# Patient Record
Sex: Male | Born: 1980 | Race: White | Hispanic: No | Marital: Married | Smoking: Never smoker
Health system: Southern US, Community
[De-identification: ages and names within clinical notes are randomized; demographics above are authoritative.]

## PROBLEM LIST (undated history)

## (undated) DIAGNOSIS — L719 Rosacea, unspecified: Secondary | ICD-10-CM

## (undated) DIAGNOSIS — L649 Androgenic alopecia, unspecified: Secondary | ICD-10-CM

## (undated) DIAGNOSIS — G4730 Sleep apnea, unspecified: Secondary | ICD-10-CM

## (undated) DIAGNOSIS — H7290 Unspecified perforation of tympanic membrane, unspecified ear: Secondary | ICD-10-CM

## (undated) DIAGNOSIS — G4733 Obstructive sleep apnea (adult) (pediatric): Secondary | ICD-10-CM

## (undated) DIAGNOSIS — F418 Other specified anxiety disorders: Secondary | ICD-10-CM

## (undated) HISTORY — DX: Rosacea, unspecified: L71.9

## (undated) HISTORY — DX: Other specified anxiety disorders: F41.8

## (undated) HISTORY — DX: Unspecified perforation of tympanic membrane, unspecified ear: H72.90

## (undated) HISTORY — DX: Obstructive sleep apnea (adult) (pediatric): G47.33

## (undated) HISTORY — DX: Androgenic alopecia, unspecified: L64.9

## (undated) HISTORY — DX: Sleep apnea, unspecified: G47.30

---

## 2004-12-11 HISTORY — PX: TYMPANOPLASTY: SHX33

## 2005-12-11 HISTORY — PX: SEPTOPLASTY: SUR1290

## 2011-08-04 ENCOUNTER — Encounter: Payer: Self-pay | Admitting: Family Medicine

## 2011-08-04 ENCOUNTER — Ambulatory Visit (INDEPENDENT_AMBULATORY_CARE_PROVIDER_SITE_OTHER): Payer: BC Managed Care – PPO | Admitting: Family Medicine

## 2011-08-04 VITALS — BP 104/68 | HR 87 | Temp 98.2°F | Ht 73.0 in | Wt 187.0 lb

## 2011-08-04 DIAGNOSIS — L719 Rosacea, unspecified: Secondary | ICD-10-CM

## 2011-08-04 DIAGNOSIS — F418 Other specified anxiety disorders: Secondary | ICD-10-CM

## 2011-08-04 DIAGNOSIS — F341 Dysthymic disorder: Secondary | ICD-10-CM

## 2011-08-04 DIAGNOSIS — G4733 Obstructive sleep apnea (adult) (pediatric): Secondary | ICD-10-CM

## 2011-08-04 MED ORDER — BUPROPION HCL ER (XL) 150 MG PO TB24: 150.0000 mg | ORAL_TABLET | Freq: Every day | ORAL | Status: DC

## 2011-08-04 NOTE — Progress Notes (Signed)
  Subjective:    Patient ID: Darren Mejia, male    DOB: Dec 16, 1980, 30 y.o.   MRN: [id number]  HPI 30 yr old male to establish with [place] after moving here from [place], [place] in May. 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 He has several ongoing issues. He has been treated for depression with anxiety since 2009, and currently he is taking Lexapro and Wellbutrin. He feels much better lately and asks if he can stop one or both of these. He has rosacea, and he had been treated by a Dermatologist before. His current regimen works well. He was diagnosed with sleep apnea in 2007, and he has had several sleep studies. He had a nasal septoplasty, and he wore a dental appliance for awhile. Then he was put on CPAP last year, and this has worked very well for him. He had a cpx with labs through his employer in April of this year, and everything came out okay.    Review of Systems  Constitutional: Negative.   HENT: Negative.   Respiratory: Negative.   Cardiovascular: Negative.   Psychiatric/Behavioral: Negative.        Objective:   Physical Exam  Constitutional: He appears well-developed and well-nourished.  Cardiovascular: Normal rate, regular rhythm, normal heart sounds and intact distal pulses.   Pulmonary/Chest: Effort normal and breath sounds normal.  Psychiatric: He has a normal mood and affect. His behavior is normal. Thought content normal.          Assessment & Plan:  He seems to be doing well. We agreed to stop the Lexapro, but I advised him to stay on Wellbutrin for now. Will refer to Dermatology and Pulmonary for the rosacea and OSA.

## 2011-08-17 ENCOUNTER — Ambulatory Visit: Payer: Self-pay | Admitting: Family Medicine

## 2011-08-22 ENCOUNTER — Institutional Professional Consult (permissible substitution): Payer: BC Managed Care – PPO | Admitting: Pulmonary Disease

## 2011-09-01 ENCOUNTER — Ambulatory Visit (INDEPENDENT_AMBULATORY_CARE_PROVIDER_SITE_OTHER): Payer: BC Managed Care – PPO | Admitting: Pulmonary Disease

## 2011-09-01 ENCOUNTER — Encounter: Payer: Self-pay | Admitting: Pulmonary Disease

## 2011-09-01 VITALS — BP 110/82 | HR 76 | Temp 98.1°F | Ht 72.0 in | Wt 188.2 lb

## 2011-09-01 DIAGNOSIS — G4733 Obstructive sleep apnea (adult) (pediatric): Secondary | ICD-10-CM | POA: Insufficient documentation

## 2011-09-01 NOTE — Progress Notes (Signed)
Subjective:    Patient ID: Darren Mejia, male    DOB: Jun 29, 1981, 30 y.o.   MRN: [id number]  HPI The patient is a 30 year old male who I've been asked to see for management of obstructive sleep apnea.  He recently moved here from the [place] area, he needs to establish with a sleep physician and DME.  He was diagnosed with very mild sleep apnea in 2010, with an RDI of 9 events per hour.  He was initially treated with a dental appliance, but this resulted in jaw discomfort.  The patient decided to try CPAP in 2011, and has done very well with this.  He saw improvement in his sleep and daytime alertness with this.  The patient uses nasal pillows, and has no issues with oral venting.  He uses an auto set with pressures ranging 5-8 cm.  Down load had shown good compliance, but he does have some time at the upper pressure level of 8 cm.  His AHI has been in the range of 4 per hour on this setting.  The patient continues to have some daytime tiredness, but not true sleepiness.  He has been tried on Wellbutrin for possible low-level depression, and this has helped with his sleep and daytime symptoms.  His Epworth score today is 6, and his weight is unchanged from his last sleep study.  Sleep Questionnaire: What time do you typically go to bed?( Between what hours) 10 to 11 pm How long does it take you to fall asleep? 10 to 20 ins How many times during the night do you wake up? 3 What time do you get out of bed to start your day? 0600 Do you drive or operate heavy machinery in your occupation? No How much has your weight changed (up or down) over the past two years? (In pounds) 0 oz (0 kg) Have you ever had a sleep study before? Yes If yes, location of study[facility] [place] area If yes, date of study? June 2010 and April 2011 Do you currently use CPAP? Yes If so, what pressure? Do you wear oxygen at any time? No    Review of Systems  Constitutional: Negative for fever and unexpected weight change.   HENT: Negative for ear pain, nosebleeds, congestion, sore throat, rhinorrhea, sneezing, trouble swallowing, dental problem, postnasal drip and sinus pressure.   Eyes: Negative for redness and itching.  Respiratory: Negative for cough, chest tightness, shortness of breath and wheezing.   Cardiovascular: Negative for palpitations and leg swelling.  Gastrointestinal: Negative for nausea and vomiting.  Genitourinary: Negative for dysuria.  Musculoskeletal: Negative for joint swelling.  Skin: Negative for rash.  Neurological: Negative for headaches.  Hematological: Does not bruise/bleed easily.  Psychiatric/Behavioral: Negative for dysphoric mood. The patient is not nervous/anxious.        Objective:   Physical Exam Constitutional:  Well developed, no acute distress  HENT:  Nares patent without discharge  Oropharynx without exudate, palate and uvula are moderately elongated.  Eyes:  Perrla, eomi, no scleral icterus  Neck:  No JVD, no TMG  Cardiovascular:  Normal rate, regular rhythm, no rubs or gallops.  No murmurs        Intact distal pulses  Pulmonary :  Normal breath sounds, no stridor or respiratory distress   No rales, rhonchi, or wheezing  Abdominal:  Soft, nondistended, bowel sounds present.  No tenderness noted.   Musculoskeletal:  No lower extremity edema noted.  Lymph Nodes:  No cervical lymphadenopathy  noted  Skin:  No cyanosis noted  Neurologic:  Alert, does not appear sleepy, moves all 4 extremities without obvious deficit. .       Assessment & Plan:

## 2011-09-01 NOTE — Assessment & Plan Note (Signed)
The patient has been on CPAP for his very mild sleep apnea, and feels that his symptoms are definitely improved with the device.  He has persistent daytime fatigue/tiredness, but it is unclear whether this is truly due to his sleep disordered breathing or possibly to something such as mild depression?  His download did show that he reached the ceiling pressure of 8 cm for a fair amount of time, and I would like to try him on a little bit higher pressure to see if he notices a difference.  The patient is willing to give this a try.

## 2011-09-01 NOTE — Patient Instructions (Signed)
Will see how you do on expanded pressure range on auto to 5-12cm.  Will get download and call you with results when available. Will get you assigned to new dme. If doing well, would like to see you back in one year.

## 2011-11-01 ENCOUNTER — Ambulatory Visit: Payer: BC Managed Care – PPO | Admitting: Family Medicine

## 2011-11-21 ENCOUNTER — Ambulatory Visit: Payer: BC Managed Care – PPO | Admitting: Family Medicine

## 2011-12-21 ENCOUNTER — Encounter: Payer: Self-pay | Admitting: Family Medicine

## 2011-12-21 ENCOUNTER — Ambulatory Visit (INDEPENDENT_AMBULATORY_CARE_PROVIDER_SITE_OTHER): Payer: BC Managed Care – PPO | Admitting: Family Medicine

## 2011-12-21 VITALS — BP 120/58 | HR 82 | Temp 98.4°F | Wt 186.0 lb

## 2011-12-21 DIAGNOSIS — F418 Other specified anxiety disorders: Secondary | ICD-10-CM

## 2011-12-21 DIAGNOSIS — F341 Dysthymic disorder: Secondary | ICD-10-CM

## 2011-12-21 NOTE — Progress Notes (Signed)
  Subjective:    Patient ID: Darren Mejia, male    DOB: May 11, 1981, 31 y.o.   MRN: [id number]  HPI Here to follow up on anxiety and depression. Several months ago he stopped Lexapro and stayed on Wellbutrin. He did well with this transition, and now he is thinking about coming off wellbutrin. He feels well and his mood is excellent. He sleeps well. He is exercising regularly.    Review of Systems  Constitutional: Negative.   Psychiatric/Behavioral: Negative.        Objective:   Physical Exam  Constitutional: He appears well-developed and well-nourished.  Psychiatric: He has a normal mood and affect. His behavior is normal. Judgment and thought content normal.          Assessment & Plan:  He is doing well. He will stop the Wellbutrin immediately. Recheck prn

## 2012-07-20 ENCOUNTER — Ambulatory Visit (INDEPENDENT_AMBULATORY_CARE_PROVIDER_SITE_OTHER): Payer: BC Managed Care – PPO | Admitting: Family Medicine

## 2012-07-20 ENCOUNTER — Encounter: Payer: Self-pay | Admitting: Family Medicine

## 2012-07-20 VITALS — BP 124/86 | HR 61 | Temp 97.4°F | Ht 72.0 in | Wt 164.0 lb

## 2012-07-20 DIAGNOSIS — H6690 Otitis media, unspecified, unspecified ear: Secondary | ICD-10-CM

## 2012-07-20 MED ORDER — CIPROFLOXACIN-DEXAMETHASONE 0.3-0.1 % OT SUSP: 4.0000 [drp] | Freq: Two times a day (BID) | OTIC | Status: DC

## 2012-07-20 NOTE — Patient Instructions (Addendum)
Start the Ciprodex twice daily for the ear infection for 7 days Avoid water in the ear Call with any questions or concerns Hang in there!!!

## 2012-07-20 NOTE — Progress Notes (Signed)
  Subjective:    Patient ID: Darren Mejia, male    DOB: [birth year], 31 y.o.   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  Wears custom fit ear plug but got water in anyway. No sensation of fluid in the ear, 'feels mildly clogged', some discomfort.  No drainage from ear.    Review of Systems For ROS see HPI     Objective:   Physical Exam  Vitals reviewed. Constitutional: He appears well-developed and well-nourished. No distress.  HENT:  Head: Normocephalic and atraumatic.  Right Ear: External ear normal.  Left Ear: External ear normal.  Nose: Nose normal.  Mouth/Throat: Oropharynx is clear and moist.       L TM w/ chronic perforation, erythema and fluid present R TM WNL  Neck: Normal range of motion. Neck supple.  Lymphadenopathy:    He has no cervical adenopathy.  Skin: He is not diaphoretic.          Assessment & Plan:

## 2012-07-23 NOTE — Assessment & Plan Note (Signed)
New to provider.  Start Cipro drops for early infxn.  Reviewed supportive care and red flags that should prompt return.  Pt expressed understanding and is in agreement w/ plan.

## 2012-09-02 ENCOUNTER — Ambulatory Visit: Payer: BC Managed Care – PPO | Admitting: Internal Medicine

## 2012-09-03 ENCOUNTER — Ambulatory Visit (INDEPENDENT_AMBULATORY_CARE_PROVIDER_SITE_OTHER): Payer: BC Managed Care – PPO | Admitting: Pulmonary Disease

## 2012-09-03 ENCOUNTER — Encounter: Payer: Self-pay | Admitting: Pulmonary Disease

## 2012-09-03 VITALS — BP 110/80 | HR 59 | Temp 98.2°F | Ht 72.0 in | Wt 165.8 lb

## 2012-09-03 DIAGNOSIS — G4733 Obstructive sleep apnea (adult) (pediatric): Secondary | ICD-10-CM

## 2012-09-03 NOTE — Assessment & Plan Note (Signed)
The patient overall is doing well on his CPAP.  He is having no issues with his mask or pressure, and has been keeping up with his supplies.  I will try and get a download off his machine, and I have asked him to continue with his CPAP as he is doing.  Followup with me in one year.

## 2012-09-03 NOTE — Patient Instructions (Addendum)
Continue with cpap, and keep up with mask changes and supplies. Will download your card and send back to you. followup with me in one year if doing well.

## 2012-09-03 NOTE — Progress Notes (Signed)
  Subjective:    Patient ID: Darren Mejia, male    DOB: 1981-02-06, 31 y.o.   MRN: [id number]  HPI The patient comes in today for followup of his obstructive sleep apnea.  He's been wearing CPAP compliant, and is having no issues with his mask fit or pressure.  He feels that he is sleeping well overall, and is satisfied with his daytime alertness.   Review of Systems  Constitutional: Negative for fever, chills, diaphoresis, activity change, appetite change, fatigue and unexpected weight change.  HENT: Negative for nosebleeds, congestion, sore throat, rhinorrhea, voice change and postnasal drip.   Eyes: Negative for visual disturbance.  Respiratory: Negative for cough, chest tightness, shortness of breath and wheezing.   Cardiovascular: Negative for chest pain and palpitations.  Gastrointestinal: Negative for nausea, vomiting and constipation.  Genitourinary: Negative for flank pain and difficulty urinating.  Musculoskeletal: Negative for joint swelling.  Skin: Negative for rash.  Neurological: Negative for dizziness, syncope, weakness, light-headedness, numbness and headaches.  Hematological: Does not bruise/bleed easily.  Psychiatric/Behavioral: Negative for confusion and agitation. The patient is not nervous/anxious.        Objective:   Physical Exam Thin male in no acute distress No skin breakdown or pressure necrosis from the CPAP mask Lower extremities without edema, no cyanosis Alert and oriented, does not appear to be sleepy, moves all 4 extremities.       Assessment & Plan:

## 2012-11-03 ENCOUNTER — Telehealth: Payer: Self-pay | Admitting: Pulmonary Disease

## 2012-11-03 ENCOUNTER — Other Ambulatory Visit: Payer: Self-pay | Admitting: Pulmonary Disease

## 2012-11-03 DIAGNOSIS — G4733 Obstructive sleep apnea (adult) (pediatric): Secondary | ICD-10-CM

## 2012-11-03 NOTE — Telephone Encounter (Signed)
Please let pt that I have his recent download, and shows excellent compliance, and very good control of his sleep apnea.  I can't remember if he left his card with [place] or not.  I think he did, and if so, we are supposed to send back to him.  I can't remember who was with me that day.  ?Kim?  See if she has this card.  Thanks.

## 2012-11-04 NOTE — Telephone Encounter (Signed)
LMOMTCB x 1 

## 2012-11-05 ENCOUNTER — Encounter: Payer: Self-pay | Admitting: Family Medicine

## 2012-11-05 ENCOUNTER — Ambulatory Visit (INDEPENDENT_AMBULATORY_CARE_PROVIDER_SITE_OTHER): Payer: BC Managed Care – PPO | Admitting: Family Medicine

## 2012-11-05 VITALS — BP 112/76 | HR 90 | Temp 98.2°F | Wt 160.0 lb

## 2012-11-05 DIAGNOSIS — IMO0002 Reserved for concepts with insufficient information to code with codable children: Secondary | ICD-10-CM

## 2012-11-05 DIAGNOSIS — S76019A Strain of muscle, fascia and tendon of unspecified hip, initial encounter: Secondary | ICD-10-CM

## 2012-11-05 NOTE — Telephone Encounter (Signed)
Pt notified of download results per Dr. Shelle Iron. Pt says he did receive his card in the mail for his machine.

## 2012-11-05 NOTE — Progress Notes (Signed)
  Subjective:    Patient ID: Darren Mejia, male    DOB: Sep 27, 1981, 31 y.o.   MRN: [id number]  HPI Here for recurrent pain in the right hip and groin after it started to hurt one month ago. He runs 5 miles at a time 3-4 days a week and swims on days in between. After this pain began he thought it was a muscle strain so he stoppped running a few times, the longest being for 10 days. Each time it felt better but then the pain returned when he resumed running. He noe has avoided running the past 13 days and the pain is better. Using Advil prn.    Review of Systems  Constitutional: Negative.   Musculoskeletal: Positive for myalgias and arthralgias.       Objective:   Physical Exam  Constitutional: He appears well-developed and well-nourished.  Musculoskeletal:       Tender in the right groin and along the right anterior superior iliac spine, full ROM, no edema           Assessment & Plan:  This is a hip flexor strain. He needs to rest the area long enough to let it heal, and he needs to avoid running for 4 more weeks (a total of 6 weeks off). Then ease back into running.

## 2012-11-05 NOTE — Telephone Encounter (Signed)
Pt returned call. Darren Mejia  

## 2012-12-11 HISTORY — PX: HIP ARTHROSCOPY: SUR88

## 2012-12-16 ENCOUNTER — Ambulatory Visit (INDEPENDENT_AMBULATORY_CARE_PROVIDER_SITE_OTHER): Payer: BC Managed Care – PPO | Admitting: Family Medicine

## 2012-12-16 ENCOUNTER — Encounter: Payer: Self-pay | Admitting: Family Medicine

## 2012-12-16 VITALS — BP 118/78 | HR 90 | Temp 98.1°F | Wt 164.0 lb

## 2012-12-16 DIAGNOSIS — F411 Generalized anxiety disorder: Secondary | ICD-10-CM

## 2012-12-16 DIAGNOSIS — F419 Anxiety disorder, unspecified: Secondary | ICD-10-CM

## 2012-12-16 DIAGNOSIS — S76019A Strain of muscle, fascia and tendon of unspecified hip, initial encounter: Secondary | ICD-10-CM

## 2012-12-16 DIAGNOSIS — IMO0002 Reserved for concepts with insufficient information to code with codable children: Secondary | ICD-10-CM

## 2012-12-16 MED ORDER — PROPRANOLOL HCL 10 MG PO TABS: 10.0000 mg | ORAL_TABLET | Freq: Every day | ORAL | Status: DC | PRN

## 2012-12-16 NOTE — Progress Notes (Signed)
  Subjective:    Patient ID: Golden Gilreath, male    DOB: 1981/08/26, 32 y.o.   MRN: [id number]  HPI Here to follow up on a right hip flexor injury and to discuss anxiety. He was here about 6 weeks ago, and we recommended he stop running for 4 weeks and swim instead to allow the injury to heal. He did this, and in fact he felt much better. The pain had completely resolved so he started running again. He did well the first day but the second day the pain returned, and it is still bothering him a little. Also he has been under a lot of job stress lately and he has had to do some public speaking and some presentations. He finds these very difficult because he gets very anxious, his voice cracks,his heart pounds, etc.    Review of Systems  Constitutional: Negative.   Musculoskeletal: Positive for arthralgias.  Psychiatric/Behavioral: The patient is nervous/anxious.        Objective:   Physical Exam  Constitutional: He appears well-developed and well-nourished.  Cardiovascular: Normal rate, regular rhythm, normal heart sounds and intact distal pulses.   Pulmonary/Chest: Effort normal and breath sounds normal.  Musculoskeletal:       Tender over the right hip flexor tendon, no masses, full ROM  Psychiatric: He has a normal mood and affect. His behavior is normal. Thought content normal.          Assessment & Plan:  We will send him to PT for the hip flexor strain, and they can give him more detailed advice about exercising. Try Propranolol for days when he has a presentation at work.

## 2012-12-17 ENCOUNTER — Telehealth: Payer: Self-pay | Admitting: Family Medicine

## 2012-12-17 NOTE — Telephone Encounter (Signed)
Pt was here yesterday, saw Dr. Clent Ridges, and a PT referral was entered. Dr. Clent Ridges said he could do [facility], or someone else, the pt had a preference. Pt would like to do his PT at [facility] - with Erick Alley. Their fax # is: [phone].

## 2013-01-02 ENCOUNTER — Telehealth: Payer: Self-pay | Admitting: Family Medicine

## 2013-01-02 NOTE — Telephone Encounter (Signed)
Pt has been attending PT. Therapist recommend iontophoresis patch, but would also recommend an oral anti-inflammatory for pt. Pt would like to know if that would be ok together w/ the patch therapy, and at what dose? Pls advise. Pharm: [facility]

## 2013-01-03 NOTE — Telephone Encounter (Signed)
That would be fine. Call in Diclofenac 75 mg bid, #60 with 5 rf

## 2013-01-06 MED ORDER — DICLOFENAC SODIUM 75 MG PO TBEC: 75.0000 mg | DELAYED_RELEASE_TABLET | Freq: Two times a day (BID) | ORAL | Status: DC

## 2013-01-06 NOTE — Telephone Encounter (Signed)
Patient called stating that he would like his request processed today.

## 2013-01-06 NOTE — Telephone Encounter (Signed)
Pt waiting to hear about advice/ medicine.

## 2013-01-06 NOTE — Telephone Encounter (Signed)
I sent script e-scribe and left voice message for pt 

## 2013-03-10 ENCOUNTER — Other Ambulatory Visit: Payer: Self-pay | Admitting: Orthopedic Surgery

## 2013-03-10 DIAGNOSIS — M25551 Pain in right hip: Secondary | ICD-10-CM

## 2013-03-17 ENCOUNTER — Ambulatory Visit
Admission: RE | Admit: 2013-03-17 | Discharge: 2013-03-17 | Disposition: A | Discharge status: Home / Self Care | Payer: BC Managed Care – PPO | Source: Ambulatory Visit | Attending: Orthopedic Surgery | Admitting: Orthopedic Surgery

## 2013-03-17 DIAGNOSIS — M25551 Pain in right hip: Secondary | ICD-10-CM

## 2013-03-17 MED ORDER — IOHEXOL 180 MG/ML  SOLN: 13.0000 mL | Freq: Once | INTRAMUSCULAR | Status: AC | PRN

## 2013-04-14 ENCOUNTER — Telehealth: Payer: Self-pay | Admitting: Family Medicine

## 2013-04-14 NOTE — Telephone Encounter (Signed)
Patient Information:  Caller Name: Joanthan  Phone: [phone]  Patient: Darren Mejia, Darren Mejia  Gender: Male  DOB: March 22, 1981  Age: 32 Years  PCP: Gershon Crane [facility])  Office Follow Up:  Does the office need to follow up with this patient?: No  Instructions For The Office: N/A  RN Note:  Patient unable to arrange appointment for 5-6 due to scheduling conflict. Wants appointment for 5-7 and scheduled. Call back parametrs given.  Symptoms  Reason For Call & Symptoms: Has perforated ear drum since "I was a child". Was swimming 5-4 and has gotten water in ear causing pain. Has used Ciprodex that was left from when used in past. Is almost out and wants to know what to do. No drainage, swelling of outer ear.   Reviewed Health History In EMR: Yes  Reviewed Medications In EMR: Yes  Reviewed Allergies In EMR: Yes  Reviewed Surgeries / Procedures: Yes  Date of Onset of Symptoms: 04/13/2013  Guideline(s) Used:  Earache  Disposition Per Guideline:   See Today in Office  Reason For Disposition Reached:   All other earaches (Exceptions: earache lasting < 1 hour, and earache from air travel)  Advice Given:  N/A  Patient Will Follow Care Advice:  YES  Appointment Scheduled:  04/16/2013 09:30:00 Appointment Scheduled Provider:  Gershon Crane [facility])

## 2013-04-16 ENCOUNTER — Encounter: Payer: Self-pay | Admitting: Family Medicine

## 2013-04-16 ENCOUNTER — Ambulatory Visit (INDEPENDENT_AMBULATORY_CARE_PROVIDER_SITE_OTHER): Payer: BC Managed Care – PPO | Admitting: Family Medicine

## 2013-04-16 VITALS — BP 110/70 | HR 79 | Temp 98.2°F | Wt 161.0 lb

## 2013-04-16 DIAGNOSIS — H60392 Other infective otitis externa, left ear: Secondary | ICD-10-CM

## 2013-04-16 DIAGNOSIS — H60399 Other infective otitis externa, unspecified ear: Secondary | ICD-10-CM

## 2013-04-16 MED ORDER — CIPROFLOXACIN-DEXAMETHASONE 0.3-0.1 % OT SUSP: 4.0000 [drp] | Freq: Two times a day (BID) | OTIC | Status: DC

## 2013-04-16 NOTE — Progress Notes (Signed)
  Subjective:    Patient ID: Darren Mejia, male    DOB: [birth year], 32 y.o.   MRN: [id number]  HPI Here for left ear pain. He has a chronic left TM perforation, and he swims regularly for exercise. Even though he wears a molded ear plug, he still gets water in the ear several times a year which leads to an infection. Ciprodex usually works well for him. No URI or sinus symptoms.    Review of Systems  Constitutional: Negative.   HENT: Positive for ear pain. Negative for hearing loss, congestion, sinus pressure, tinnitus and ear discharge.   Eyes: Negative.        Objective:   Physical Exam  Constitutional: He appears well-developed and well-nourished.  HENT:  Right Ear: External ear normal.  Nose: Nose normal.  Mouth/Throat: Oropharynx is clear and moist.  Left TM has a large perforation. The external canal is pink           Assessment & Plan:  Treat with Ciprodex again

## 2013-04-17 ENCOUNTER — Telehealth: Payer: Self-pay | Admitting: Family Medicine

## 2013-04-17 MED ORDER — PROPRANOLOL HCL 10 MG PO TABS: 10.0000 mg | ORAL_TABLET | Freq: Every day | ORAL | Status: DC | PRN

## 2013-04-17 NOTE — Telephone Encounter (Signed)
Refill request for Propranol 10 mg and a 90 day supply, which I did send e-scribe.

## 2013-04-24 ENCOUNTER — Encounter (INDEPENDENT_AMBULATORY_CARE_PROVIDER_SITE_OTHER): Payer: Self-pay | Admitting: General Surgery

## 2013-04-24 ENCOUNTER — Ambulatory Visit (INDEPENDENT_AMBULATORY_CARE_PROVIDER_SITE_OTHER): Payer: BC Managed Care – PPO | Admitting: General Surgery

## 2013-04-24 VITALS — BP 128/86 | HR 55 | Temp 97.6°F | Ht 72.0 in | Wt 159.6 lb

## 2013-04-24 DIAGNOSIS — IMO0002 Reserved for concepts with insufficient information to code with codable children: Secondary | ICD-10-CM

## 2013-04-24 DIAGNOSIS — S76219A Strain of adductor muscle, fascia and tendon of unspecified thigh, initial encounter: Secondary | ICD-10-CM

## 2013-04-24 NOTE — Progress Notes (Signed)
Patient ID: Darren Mejia, male   DOB: 1981-09-20, 32 y.o.   MRN: [id number]  Chief Complaint  Patient presents with  . New Evaluation    eval hernia    HPI Darren Mejia is a 32 y.o. male.   HPI  He is referred by Dr. Dannielle Huh  for further evaluation of right groin pain and possible sports hernia.  He was in his normal state of health until approximately 10/07/2012. He had been running every other day up to that time. He ran 2 days in the row and began having some pain in the right groin area. He ran again 2 days later and felt that the pain was a little worse. He rested a few days and then ran again which made the pain worse still. He rested a week after that and still had significant pain. He subsequently stopped running. He saw his primary care physician and was given a diagnosis of flexor strain. He was advised not to run for 4 more weeks. He started doing some swimming.  After being off running for about 6 weeks, and he tried running again in late December which created the pain once again. He subsequently underwent physical therapy and started taking a nonsteroidal anti-inflammatory drug. This caused improvement of his pain but whenever he tried to increase his activity the pain returned. He was swimming with a pull bouy at this time successfully. He stated the pain at that time was sometimes in the lateral aspect of leg, the buttock area, where the anterior aspect of the leg. He was able to do upper body lifting without pain. He then saw Dr. Sherlean Foot in late January. A plain x-ray was unremarkable. He underwent 6 weeks of physical therapy but was not better. An MRI was ordered which did not demonstrate any pathology. Dr. Sherlean Foot felt most likely to as a hip flexor/adductor strain. However he could not rule out sports hernia or lower back issue causing the pain. Subsequently he was referred to me.  Now, even upper body weight lifting and swimming causing discomfort. There is no family history of inguinal  hernia. He is a heel to toe runner.  Past Medical History  Diagnosis Date  . Male pattern baldness   . Depression with anxiety   . Acne rosacea   . OSA (obstructive sleep apnea)   . Anxiety   . Depression   . Perforated ear drum     left TM     Past Surgical History  Procedure Laterality Date  . Nasal septum surgery  2007  . Tympanoplasty  2006    left side     Family History  Problem Relation Age of Onset  . Heart disease Father   . Stroke Father   . Hypertension Father   . Atrial fibrillation Father   . Emphysema Paternal Grandfather     Social History History  Substance Use Topics  . Smoking status: Never Smoker   . Smokeless tobacco: Never Used  . Alcohol Use: 1.5 oz/week    3 drink(s) per week    No Known Allergies  Current Outpatient Prescriptions  Medication Sig Dispense Refill  . Clindamycin Phosphate foam Apply topically daily.        . diclofenac (VOLTAREN) 75 MG EC tablet Take 1 tablet (75 mg total) by mouth 2 (two) times daily.  60 tablet  5  . fish oil-omega-3 fatty acids 1000 MG capsule Take 3 capsules by mouth daily.       Marland Kitchen  folic acid (FOLVITE) 400 MCG tablet Take 400 mcg by mouth daily.      . Multiple Vitamin (MULTIVITAMIN) tablet Take 1 tablet by mouth daily.        . propranolol (INDERAL) 10 MG tablet Take 1 tablet (10 mg total) by mouth daily as needed (public speaking).  90 tablet  1  . tazarotene (TAZORAC) 0.1 % gel Apply topically at bedtime.         No current facility-administered medications for this visit.    Review of Systems Review of Systems  Constitutional: Negative.   Respiratory: Negative.   Cardiovascular: Negative.   Gastrointestinal: Negative.   Genitourinary: Negative for testicular pain.    Blood pressure 128/86, pulse 55, temperature 97.6 F (36.4 C), temperature source Temporal, height 6' (1.829 m), weight 159 lb 9.6 oz (72.394 kg), SpO2 97.00%.  Physical Exam Physical Exam  Constitutional:  Thin male in  NAD.  HENT:  Head: Normocephalic and atraumatic.  Abdominal: Soft. He exhibits no distension. There is no tenderness.  No umbilical hernia.  Genitourinary:  No groin swelling present. No obvious testicular masses. No inguinal hernias on exam with a Valsalva maneuver. The left external ring is slightly widened versus the right groin.  There is tenderness in the adductor tendon with external rotation.    Data Reviewed MRI.  Dr. Tobin Chad note.  Assessment    Right groin and hip pain brought on by running. History and physical exam as was the MRI are not consistent with sports hernia at this time. Pain tends to be migratory and is now felt sometimes in the buttock as well as lateral left hip area. I agree with Dr. Valentina Gu that the most likely cause of this pain is a flexor tendinitis that has been persistent. I do not think an operation would be helpful to him at this time.     Plan    I recommend a complete rest from any type of physical activity including yardwork for 6-8 weeks. I recommend continuing the nonsteroidal anti-inflammatory agents. I recommend moist heat to the area. After this period of time, I recommend he slowly ease his way back into non-running non-swimming activities.       Hope Holst J 04/24/2013, 4:52 PM

## 2013-04-24 NOTE — Patient Instructions (Signed)
In my opinion, I do not believe you have a sports hernia. I would recommend that you withdrawal from any type of physical activity for at least 6 weeks. Continue the diclofenac. You can apply moist heat to the area. After 6 weeks, lowly start to resume activities other than swimming and running as we discussed.

## 2013-08-13 ENCOUNTER — Telehealth: Payer: Self-pay | Admitting: Pulmonary Disease

## 2013-08-13 NOTE — Telephone Encounter (Signed)
left messages for pt to call back to schedule follow up apt. No return calls back. Sent letter 08/13/13 ° °

## 2013-08-19 ENCOUNTER — Other Ambulatory Visit (INDEPENDENT_AMBULATORY_CARE_PROVIDER_SITE_OTHER): Payer: BC Managed Care – PPO

## 2013-08-19 DIAGNOSIS — Z0000 Encounter for general adult medical examination without abnormal findings: Secondary | ICD-10-CM

## 2013-08-19 LAB — CBC WITH DIFFERENTIAL/PLATELET
Basophils Absolute: 0 10*3/uL (ref 0.0–0.1)
Eosinophils Relative: 1.6 % (ref 0.0–5.0)
HCT: 43.7 % (ref 39.0–52.0)
Lymphocytes Relative: 43.9 % (ref 12.0–46.0)
Lymphs Abs: 2.1 10*3/uL (ref 0.7–4.0)
Monocytes Relative: 6.3 % (ref 3.0–12.0)
Neutrophils Relative %: 47.8 % (ref 43.0–77.0)
Platelets: 186 10*3/uL (ref 150.0–400.0)
RDW: 12.5 % (ref 11.5–14.6)
WBC: 4.7 10*3/uL (ref 4.5–10.5)

## 2013-08-19 LAB — BASIC METABOLIC PANEL
CO2: 27 mEq/L (ref 19–32)
Chloride: 105 mEq/L (ref 96–112)
GFR: 104.16 mL/min (ref >60.00)
Sodium: 140 mEq/L (ref 135–145)

## 2013-08-19 LAB — HEPATIC FUNCTION PANEL
ALT: 18 U/L (ref 0–53)
AST: 23 U/L (ref 0–37)
Alkaline Phosphatase: 42 U/L (ref 39–117)
Bilirubin, Direct: 0.1 mg/dL (ref 0.0–0.3)
Total Bilirubin: 1.1 mg/dL (ref 0.3–1.2)

## 2013-08-19 LAB — LIPID PANEL
HDL: 62.1 mg/dL (ref >39.00)
Total CHOL/HDL Ratio: 3
Triglycerides: 47 mg/dL (ref 0.0–149.0)
VLDL: 9.4 mg/dL (ref 0.0–40.0)

## 2013-08-19 LAB — POCT URINALYSIS DIPSTICK
Bilirubin, UA: NEGATIVE
Blood, UA: NEGATIVE
Glucose, UA: NEGATIVE
Ketones, UA: NEGATIVE
Nitrite, UA: NEGATIVE
pH, UA: 7.5

## 2013-08-19 LAB — TSH: TSH: 1.17 u[IU]/mL (ref 0.35–5.50)

## 2013-08-22 NOTE — Progress Notes (Signed)
Quick Note:  Pt has appointment on 08/26/13 will go over then. ______

## 2013-08-26 ENCOUNTER — Ambulatory Visit (INDEPENDENT_AMBULATORY_CARE_PROVIDER_SITE_OTHER): Payer: BC Managed Care – PPO | Admitting: Family Medicine

## 2013-08-26 ENCOUNTER — Encounter: Payer: Self-pay | Admitting: Family Medicine

## 2013-08-26 ENCOUNTER — Encounter: Payer: BC Managed Care – PPO | Admitting: Family Medicine

## 2013-08-26 VITALS — BP 118/74 | HR 65 | Temp 98.5°F | Ht 72.25 in | Wt 157.0 lb

## 2013-08-26 DIAGNOSIS — Z0000 Encounter for general adult medical examination without abnormal findings: Secondary | ICD-10-CM

## 2013-08-26 DIAGNOSIS — H7290 Unspecified perforation of tympanic membrane, unspecified ear: Secondary | ICD-10-CM

## 2013-08-26 DIAGNOSIS — H7292 Unspecified perforation of tympanic membrane, left ear: Secondary | ICD-10-CM

## 2013-08-26 NOTE — Progress Notes (Signed)
Subjective:    Patient ID: Darren Mejia, male    DOB: October 16, 1981, 32 y.o.   MRN: [id number]  HPI 31 yr old male for a cpx. He has a number of issues to discuss. He is still struggling with chronic right hip and groin pain, and he has seen Dr. Queen Blossom and Dr. Avel Peace for this. They have ruled out a sports hernia, since he had a normal MRI of the area. He was then referred to Dr. Renella Cunas at [facility], and he thinks he may have an acetabular tear. He is scheduled for hip arthroscopy on 10-22-13. He has stopped running and is walking and swimming for exercise. Also he has had trouble sleeping lately, even though he wears his CPAP faithfully. He has been under a lot of stress lately, and part of this is infertility issues. He and his wife have been trying to get pregnant for years, and they are currently undergoing their second trial of IVF. He had been on Lexapro in the past, but he is not sure if he wants to use this again or not. He also mentions occasional blood on the toilet paper after a BM. His stools are never painful but they tend to be hard. He eats plenty of fiber and drinks plenty of water. Lastly he still has trouble with mild pain in the left ear when he swims. He has a perforated left TM, and he wears a mold in the canal when he swims. This is old and he thinks he needs a new one.    Review of Systems  Constitutional: Negative.   HENT: Positive for ear pain. Negative for hearing loss, nosebleeds, congestion, sore throat, facial swelling, rhinorrhea, sneezing, drooling, mouth sores, trouble swallowing, neck pain, neck stiffness, dental problem, voice change, postnasal drip, sinus pressure, tinnitus and ear discharge.   Eyes: Negative.   Respiratory: Negative.   Cardiovascular: Negative.   Gastrointestinal: Positive for anal bleeding. Negative for nausea, vomiting, abdominal pain, diarrhea, constipation, blood in stool, abdominal distention and rectal pain.  Genitourinary:  Negative.   Musculoskeletal: Negative.   Skin: Negative.   Neurological: Negative.   Psychiatric/Behavioral: Negative.        Objective:   Physical Exam  Constitutional: He is oriented to person, place, and time. He appears well-developed and well-nourished. No distress.  HENT:  Head: Normocephalic and atraumatic.  Right Ear: External ear normal.  Nose: Nose normal.  Mouth/Throat: Oropharynx is clear and moist. No oropharyngeal exudate.  Left TM has a small perforation  Eyes: Conjunctivae and EOM are normal. Pupils are equal, round, and reactive to light. Right eye exhibits no discharge. Left eye exhibits no discharge. No scleral icterus.  Neck: Neck supple. No JVD present. No tracheal deviation present. No thyromegaly present.  Cardiovascular: Normal rate, regular rhythm, normal heart sounds and intact distal pulses.  Exam reveals no gallop and no friction rub.   No murmur heard. Pulmonary/Chest: Effort normal and breath sounds normal. No respiratory distress. He has no wheezes. He has no rales. He exhibits no tenderness.  Abdominal: Soft. Bowel sounds are normal. He exhibits no distension and no mass. There is no tenderness. There is no rebound and no guarding.  Genitourinary: Penis normal. No penile tenderness.  Small anal fissure   Musculoskeletal: Normal range of motion. He exhibits no edema and no tenderness.  Lymphadenopathy:    He has no cervical adenopathy.  Neurological: He is alert and oriented to person, place, and time. He has normal reflexes. No  cranial nerve deficit. He exhibits normal muscle tone. Coordination normal.  Skin: Skin is warm and dry. No rash noted. He is not diaphoretic. No erythema. No pallor.  Psychiatric: He has a normal mood and affect. His behavior is normal. Judgment and thought content normal.          Assessment & Plan:  Well exam. For the anal fissure he needs to keep the stools soft, so he will try Metamucil daily. We will refer to ENT to  have a new ear mold made. He will try melatonin OTC for sleep.

## 2013-09-09 ENCOUNTER — Encounter: Payer: Self-pay | Admitting: Pulmonary Disease

## 2013-09-09 ENCOUNTER — Ambulatory Visit (INDEPENDENT_AMBULATORY_CARE_PROVIDER_SITE_OTHER): Payer: BC Managed Care – PPO | Admitting: Pulmonary Disease

## 2013-09-09 VITALS — BP 108/70 | HR 64 | Temp 98.7°F | Ht 72.0 in | Wt 161.4 lb

## 2013-09-09 DIAGNOSIS — G4733 Obstructive sleep apnea (adult) (pediatric): Secondary | ICD-10-CM

## 2013-09-09 NOTE — Patient Instructions (Addendum)
Continue with cpap, and keep up with mask changes and supplies. Will download your card, and send it back to you. Let me know if you need prescription for travel cpap followup with me in one year.

## 2013-09-09 NOTE — Progress Notes (Signed)
  Subjective:    Patient ID: Darren Mejia, male    DOB: 02-22-1981, 32 y.o.   MRN: [id number]  HPI Patient comes in today for followup of his known obstructive sleep apnea.  He is wearing CPAP compliantly, and is having no issues with his mask or pressure.  He does feel that he gets benefit from the CPAP, with improved daytime alertness.  He is interested in getting a travel CPAP that will be more compact on his trips.   Review of Systems  Constitutional: Negative for fever and unexpected weight change.  HENT: Negative for ear pain, nosebleeds, congestion, sore throat, rhinorrhea, sneezing, trouble swallowing, dental problem, postnasal drip and sinus pressure.   Eyes: Negative for redness and itching.  Respiratory: Negative for cough, chest tightness, shortness of breath and wheezing.   Cardiovascular: Negative for palpitations and leg swelling.  Gastrointestinal: Negative for nausea and vomiting.  Genitourinary: Negative for dysuria.  Musculoskeletal: Negative for joint swelling.  Skin: Negative for rash.  Neurological: Negative for headaches.  Hematological: Does not bruise/bleed easily.  Psychiatric/Behavioral: Negative for dysphoric mood. The patient is not nervous/anxious.        Objective:   Physical Exam Thin male in no acute distress Nose without purulence or discharge noted No skin breakdown or pressure necrosis from the CPAP mask Neck without lymphadenopathy or thyromegaly Lower extremities without edema, cyanosis Alert and oriented, moves all 4 extremities.       Assessment & Plan:

## 2013-09-09 NOTE — Assessment & Plan Note (Signed)
The patient has been doing well with CPAP, and feels that it does help his sleep and daytime alertness.  I have asked him to continue on his current device, and to keep up with his mask changes and supplies.  I will see him back in one year if doing well.

## 2013-09-23 ENCOUNTER — Telehealth: Payer: Self-pay | Admitting: Family Medicine

## 2013-09-23 MED ORDER — ESCITALOPRAM OXALATE 5 MG PO TABS: 5.0000 mg | ORAL_TABLET | Freq: Every day | ORAL | Status: DC

## 2013-09-23 NOTE — Telephone Encounter (Signed)
Rx sent to pharmacy. Left a detailed message about Dr. Claris Che recommendations for pt at designated number

## 2013-09-23 NOTE — Telephone Encounter (Signed)
Pt states he does want to go back on escitalopram (LEXAPRO) 5 MG tablet . This was discussed at appt 9/16.  Pt would like to know when he restarts lexapro, should he continue melatonin?  [facility]  [place]

## 2013-09-23 NOTE — Telephone Encounter (Signed)
Pls advise.  

## 2013-09-23 NOTE — Telephone Encounter (Signed)
Call in Lexapro 5 mg daily, #30 with 11 rf. He can take this in addition to melatonin.

## 2013-09-30 ENCOUNTER — Telehealth: Payer: Self-pay | Admitting: Family Medicine

## 2013-09-30 NOTE — Telephone Encounter (Signed)
Pt needs letter of medical clearance for hip surgery fax to [facility] [phone] phone #[phone]. Pt is sch for hip surgery on 10-22-13

## 2013-10-01 DIAGNOSIS — Z0279 Encounter for issue of other medical certificate: Secondary | ICD-10-CM

## 2013-10-01 NOTE — Telephone Encounter (Signed)
The letter is ready for pickup  

## 2013-10-01 NOTE — Telephone Encounter (Signed)
I spoke with pt and he wants [place] to fax this to [facility] surgery @ [phone], which I did fax.

## 2014-08-19 ENCOUNTER — Other Ambulatory Visit: Payer: Self-pay | Admitting: Family Medicine

## 2014-08-20 ENCOUNTER — Ambulatory Visit (INDEPENDENT_AMBULATORY_CARE_PROVIDER_SITE_OTHER): Payer: BC Managed Care – PPO | Admitting: Family Medicine

## 2014-08-20 ENCOUNTER — Encounter: Payer: Self-pay | Admitting: Family Medicine

## 2014-08-20 VITALS — BP 122/71 | HR 59 | Temp 98.8°F | Ht 72.0 in | Wt 163.0 lb

## 2014-08-20 DIAGNOSIS — K625 Hemorrhage of anus and rectum: Secondary | ICD-10-CM

## 2014-08-20 DIAGNOSIS — F418 Other specified anxiety disorders: Secondary | ICD-10-CM

## 2014-08-20 DIAGNOSIS — F341 Dysthymic disorder: Secondary | ICD-10-CM

## 2014-08-20 DIAGNOSIS — R002 Palpitations: Secondary | ICD-10-CM

## 2014-08-20 MED ORDER — PROPRANOLOL HCL 10 MG PO TABS: 10.0000 mg | ORAL_TABLET | Freq: Every day | ORAL | Status: DC | PRN

## 2014-08-20 MED ORDER — ESCITALOPRAM OXALATE 5 MG PO TABS: 5.0000 mg | ORAL_TABLET | Freq: Every day | ORAL | Status: DC

## 2014-08-20 NOTE — Progress Notes (Signed)
Pre visit review using our clinic review tool, if applicable. No additional management support is needed unless otherwise documented below in the visit note. 

## 2014-08-20 NOTE — Progress Notes (Signed)
   Subjective:    Patient ID: Darren Mejia, male    DOB: [birth year], 33 y.o.   MRN: [id number]  HPI Here for several issues. First he continues to have occasional palpitations which last only a second or two. He has no chest pain, SOB, or any other sx with these. They occur once or twice a week, and they have no relationship to exercise. He had a work physcial recently with normal lab work. Also he continues to have intermittent scant red blood form the rectum with BMs. This occurs about once or twice a month. No abdominal pain or other sx. He takes Metamucil tablets daily.    Review of Systems  Constitutional: Negative.   Respiratory: Negative.   Cardiovascular: Positive for palpitations. Negative for chest pain and leg swelling.  Gastrointestinal: Positive for blood in stool. Negative for nausea, vomiting, abdominal pain, diarrhea, constipation, abdominal distention, anal bleeding and rectal pain.       Objective:   Physical Exam  Constitutional: He appears well-developed and well-nourished.  Neck: No thyromegaly present.  Cardiovascular: Normal rate, regular rhythm, normal heart sounds and intact distal pulses.   EKG normal   Pulmonary/Chest: Effort normal and breath sounds normal.  Abdominal: Soft. Bowel sounds are normal. He exhibits no distension and no mass. There is no tenderness. There is no rebound and no guarding.  Lymphadenopathy:    He has no cervical adenopathy.          Assessment & Plan:  Refer to Cardiology and to GI.

## 2014-08-27 ENCOUNTER — Telehealth: Payer: Self-pay | Admitting: Family Medicine

## 2014-08-27 DIAGNOSIS — K921 Melena: Secondary | ICD-10-CM

## 2014-08-27 DIAGNOSIS — R002 Palpitations: Secondary | ICD-10-CM

## 2014-08-27 NOTE — Telephone Encounter (Signed)
Both referrals were done  

## 2014-08-27 NOTE — Telephone Encounter (Signed)
Pt states he discussed w/ dr fry on 9/10 to be referred to  1. GI doc  2. Cardiologist Pt has not heard anything and no referral(s) in system. pls advise

## 2014-08-28 ENCOUNTER — Encounter: Payer: Self-pay | Admitting: Internal Medicine

## 2014-08-28 NOTE — Telephone Encounter (Signed)
I spoke with pt  

## 2014-09-11 ENCOUNTER — Ambulatory Visit (INDEPENDENT_AMBULATORY_CARE_PROVIDER_SITE_OTHER): Payer: BC Managed Care – PPO | Admitting: Pulmonary Disease

## 2014-09-11 ENCOUNTER — Encounter: Payer: Self-pay | Admitting: Pulmonary Disease

## 2014-09-11 VITALS — BP 120/70 | HR 60 | Temp 97.1°F | Ht 72.0 in | Wt 164.4 lb

## 2014-09-11 DIAGNOSIS — G4733 Obstructive sleep apnea (adult) (pediatric): Secondary | ICD-10-CM

## 2014-09-11 NOTE — Progress Notes (Signed)
   Subjective:    Patient ID: Darren Mejia, male    DOB: [birth year], 33 y.o.   MRN: [id number]  HPI The patient comes in today for followup of his obstructive sleep apnea. He is wearing CPAP compliantly, and is having no issues with his mask fit or pressure. His download shows been tested compliance, and no significant mask leak. His AHI is well controlled. The patient feels that he sleeps well with the device, and denies any significant daytime sleepiness.   Review of Systems  Constitutional: Negative for fever and unexpected weight change.  HENT: Negative for congestion, dental problem, ear pain, nosebleeds, postnasal drip, rhinorrhea, sinus pressure, sneezing, sore throat and trouble swallowing.   Eyes: Negative for redness and itching.  Respiratory: Negative for cough, chest tightness, shortness of breath and wheezing.   Cardiovascular: Negative for palpitations and leg swelling.  Gastrointestinal: Negative for nausea and vomiting.  Genitourinary: Negative for dysuria.  Musculoskeletal: Negative for joint swelling.  Skin: Negative for rash.  Neurological: Negative for headaches.  Hematological: Does not bruise/bleed easily.  Psychiatric/Behavioral: Negative for dysphoric mood. The patient is not nervous/anxious.        Objective:   Physical Exam Thin male in no acute distress Nose without purulence or discharge noted No skin breakdown or pressure necrosis from the CPAP mask Neck without lymphadenopathy or thyromegaly Lower extremities without edema, no cyanosis Alert and oriented, moves all 4 extremities.       Assessment & Plan:

## 2014-09-11 NOTE — Assessment & Plan Note (Signed)
The patient is doing well with CPAP overall, with excellent compliance and good control of his AHI. He is having no issues with his device, but is interested in getting a new machine. I have given him the names of a couple of devices, and he will check with his home care company

## 2014-09-11 NOTE — Patient Instructions (Signed)
Continue with cpap, and do some research on different devices.  Talk with your home care company to see if you qualify for a new device followup with me again in one year.

## 2014-09-15 ENCOUNTER — Encounter: Payer: Self-pay | Admitting: Interventional Cardiology

## 2014-09-15 ENCOUNTER — Ambulatory Visit (INDEPENDENT_AMBULATORY_CARE_PROVIDER_SITE_OTHER): Payer: BC Managed Care – PPO | Admitting: Interventional Cardiology

## 2014-09-15 VITALS — BP 118/78 | HR 57 | Ht 72.0 in | Wt 164.0 lb

## 2014-09-15 DIAGNOSIS — R002 Palpitations: Secondary | ICD-10-CM

## 2014-09-15 NOTE — Patient Instructions (Signed)
Your physician has requested that you have an exercise tolerance test. For further information please visit [email or web address]. Please also follow instruction sheet, as given.  Your physician recommends that you schedule a follow-up appointment in: 6 weeks with Dr. Eldridge DaceVaranasi.

## 2014-09-15 NOTE — Progress Notes (Signed)
Patient ID: Brennan BaileyMark Frizell, male   DOB: December 19, 1980, 33 y.o.   MRN: [id number]     Patient ID: Brennan BaileyMark Vaughn MRN: [id number] DOB/AGE: December 19, 1980 33 y.o.   Referring Physician    Reason for Consultation:palpitations  HPI: 33 y/o man who had palpitations since his early 2620s.  He describes "feeling my heartbeat while sitting, even if he had not just exercised."  He was denied disability insurance a few years ago due to something on his ECG, although his PMD felt the ECG was normal.  He saw a different MD a few years later and was recommended to have a stress test, but he did not have the stress test.   Over the past 6 months, he has not felt right with exercise.  He had hip surgery for impingment prior to that time. He is getting back to exercise now.  He is going back to swimming and he notes that he does not feel "completley right."  He feels his heart skip a beat for a second and then it resolved.  His father had AFib diagnosed in his 3850s; he later died from a stroke.    Palpitations can occur with swimming, but not usually with other exercises.  Sx are startling.  No CP, DOE or SHOB.  Stamina is improving.      Current Outpatient Prescriptions  Medication Sig Dispense Refill  . CIPRODEX otic suspension       . Clindamycin Phosphate foam Apply topically daily.        . Dapsone (ACZONE) 5 % topical gel Apply 1 application topically 2 (two) times daily.      Marland Kitchen. escitalopram (LEXAPRO) 5 MG tablet Take 1 tablet (5 mg total) by mouth daily.  90 tablet  3  . fish oil-omega-3 fatty acids 1000 MG capsule Take 3 capsules by mouth every other day.       . folic acid (FOLVITE) 400 MCG tablet Take 800 mcg by mouth daily.       . Multiple Vitamin (MULTIVITAMIN) tablet Take 1 tablet by mouth daily.        . propranolol (INDERAL) 10 MG tablet Take 1 tablet (10 mg total) by mouth daily as needed (public speaking).  90 tablet  3  . tazarotene (TAZORAC) 0.1 % gel Apply topically at bedtime.         No current  facility-administered medications for this visit.   Past Medical History  Diagnosis Date  . Male pattern baldness   . Depression with anxiety   . Acne rosacea   . OSA (obstructive sleep apnea)     wears CPAP   . Anxiety   . Depression   . Perforated ear drum     left TM     Family History  Problem Relation Age of Onset  . Heart disease Father   . Stroke Father   . Hypertension Father   . Atrial fibrillation Father   . Emphysema Paternal Grandfather     History   Social History  . Marital Status: Married    Spouse Name: N/A    Number of Children: 0  . Years of Education: N/A   Occupational History  . attorney    Social History Main Topics  . Smoking status: Never Smoker   . Smokeless tobacco: Never Used  . Alcohol Use: 1.5 oz/week    3 drink(s) per week  . Drug Use: No  . Sexual Activity: Not on file   Other Topics Concern  .  Not on file   Social History Narrative  . No narrative on file    Past Surgical History  Procedure Laterality Date  . Nasal septum surgery  2007  . Tympanoplasty  2006    left side   . Hip arthroscopy Right 2014    per Dr. Renella Cunas at [facility]       (Not in a hospital admission)  Review of systems complete and found to be negative unless listed above .  No nausea, vomiting.  No fever chills, No focal weakness,  No palpitations.  Physical Exam: Filed Vitals:   09/15/14 0921  BP: 118/78  Pulse: 57    Weight: 164 lb (74.39 kg)  Physical exam:  [place]/AT EOMI No JVD, No carotid bruit RRR S1S2  No wheezing Soft. NT, nondistended No edema. Palpable  No focal motor or sensory deficits Normal affect  Labs:   Lab Results  Component Value Date   WBC 4.7 08/19/2013   HGB 14.8 08/19/2013   HCT 43.7 08/19/2013   MCV 94.6 08/19/2013   PLT 186.0 08/19/2013   No results found for this basename: NA, K, CL, CO2, BUN, CREATININE, CALCIUM, LABALBU, PROT, BILITOT, ALKPHOS, ALT, AST, GLUCOSE,  in the last 168 hours No results found for  this basename: CKTOTAL, CKMB, CKMBINDEX, TROPONINI    Lab Results  Component Value Date   CHOL 179 08/19/2013   Lab Results  Component Value Date   HDL 62.10 08/19/2013   Lab Results  Component Value Date   LDLCALC 108* 08/19/2013   Lab Results  Component Value Date   TRIG 47.0 08/19/2013   Lab Results  Component Value Date   CHOLHDL 3 08/19/2013   No results found for this basename: LDLDIRECT       EKG: SB, no significant ST segment changes; normal QT interval  ASSESSMENT AND PLAN:  1) Palpitations: Symptoms sound like PVCs or PACs. Air short-lived. They tend to come on with swimming. Therefore, putting the heart monitor on him may not be useful since he cannot wear the monitor while he is swimming. We'll plan for exercise treadmill test to evaluate exercise capacity as well as to see if any premature beats are elicited and if those are similar symptoms to what he has had.  No lightheadedness. No syncope. No family history of sudden cardiac death. No worrisome features for ventricular arrhythmia.  Signed:   Fredric Mare, MD, [facility] 09/15/2014, 9:39 AM

## 2014-10-05 ENCOUNTER — Ambulatory Visit (INDEPENDENT_AMBULATORY_CARE_PROVIDER_SITE_OTHER): Payer: BC Managed Care – PPO | Admitting: Physician Assistant

## 2014-10-05 DIAGNOSIS — R002 Palpitations: Secondary | ICD-10-CM

## 2014-10-05 NOTE — Progress Notes (Signed)
Exercise Treadmill Test  Pre-Exercise Testing Evaluation Rhythm: sinus bradycardia  Rate: 53     Test  Exercise Tolerance Test Ordering MD: Everette RankJay Varanasi, MD  Interpreting MD: Jacolyn ReedyMichele Lenze, PA-C  Unique Test No: 1  Treadmill:  1  Indication for ETT: palpitations  Contraindication to ETT: No   Stress Modality: exercise - treadmill  Cardiac Imaging Performed: non   Protocol: standard Bruce - maximal  Max BP:  191/63  Max MPHR (bpm):  186 85% MPR (bpm): 158  MPHR obtained (bpm):  164 % MPHR obtained:  88  Reached 85% MPHR (min:sec):  12:00 Total Exercise Time (min-sec):  13:30  Workload in METS:  16.2 Borg Scale: 18  Reason ETT Terminated:  fatigue    ST Segment Analysis At Rest: normal ST segments - no evidence of significant ST depression With Exercise: no evidence of significant ST depression  Other Information Arrhythmia:  No(isolated PVC) Angina during ETT:  absent (0) Quality of ETT:  diagnostic  ETT Interpretation:  normal - no evidence of ischemia by ST analysis  Comments: Excellent exercise tolerance. Upsloping ST depression.  Recommendations: F/u Dr. Eldridge DaceVaranasi

## 2014-10-06 ENCOUNTER — Institutional Professional Consult (permissible substitution): Payer: BC Managed Care – PPO | Admitting: Cardiology

## 2014-10-07 NOTE — Progress Notes (Signed)
Only isolated PVCs noted.  No pathologic arrhythmia.

## 2014-10-26 ENCOUNTER — Encounter: Payer: Self-pay | Admitting: Interventional Cardiology

## 2014-10-26 ENCOUNTER — Ambulatory Visit (INDEPENDENT_AMBULATORY_CARE_PROVIDER_SITE_OTHER): Payer: BC Managed Care – PPO | Admitting: Interventional Cardiology

## 2014-10-26 VITALS — BP 118/92 | HR 63 | Ht 73.0 in | Wt 162.0 lb

## 2014-10-26 DIAGNOSIS — R002 Palpitations: Secondary | ICD-10-CM

## 2014-10-26 NOTE — Progress Notes (Signed)
Patient ID: Darren Mejia, male   DOB: 05/29/1981, 33 y.o.   MRN: [id number]    [address] [place], [place]  [address] Phone: [phone] Fax:  [phone]  Date:  10/26/2014   ID:  Darren BaileyMark Tavis, DOB 05/29/1981, MRN [id number]  PCP:  Nelwyn SalisburyFRY,STEPHEN A, MD      History of Present Illness: Darren BaileyMark Shrader is a 33 y.o. male who had some palpitations several months ago. They occurred while he was swimming.  He underwent exercise treadmill testing which showed only isolated PVCs. The patient did not have symptoms with this. We discussed a monitor in the past but since his symptoms only occur while he is swimming, and external monitor was not practical. Overall, he feels quite well. He has not had any significant palpitations even while swimming in the last few weeks.   Wt Readings from Last 3 Encounters:  10/26/14 162 lb (73.483 kg)  09/15/14 164 lb (74.39 kg)  09/11/14 164 lb 6.4 oz (74.571 kg)     Past Medical History  Diagnosis Date  . Male pattern baldness   . Depression with anxiety   . Acne rosacea   . OSA (obstructive sleep apnea)     wears CPAP   . Anxiety   . Depression   . Perforated ear drum     left TM     Current Outpatient Prescriptions  Medication Sig Dispense Refill  . CIPRODEX otic suspension     . Clindamycin Phosphate foam Apply topically daily.      . Dapsone (ACZONE) 5 % topical gel Apply 1 application topically 2 (two) times daily.    Marland Kitchen. doxycycline (VIBRA-TABS) 100 MG tablet Take 100 mg by mouth 2 (two) times daily.  0  . escitalopram (LEXAPRO) 5 MG tablet Take 1 tablet (5 mg total) by mouth daily. 90 tablet 3  . fish oil-omega-3 fatty acids 1000 MG capsule Take 3 capsules by mouth every other day.     . folic acid (FOLVITE) 400 MCG tablet Take 800 mcg by mouth daily.     . Multiple Vitamin (MULTIVITAMIN) tablet Take 1 tablet by mouth daily.      . propranolol (INDERAL) 10 MG tablet Take 1 tablet (10 mg total) by mouth daily as needed (public  speaking). 90 tablet 3  . tazarotene (TAZORAC) 0.1 % gel Apply topically at bedtime.       No current facility-administered medications for this visit.    Allergies:   No Known Allergies  Social History:  The patient  reports that he has never smoked. He has never used smokeless tobacco. He reports that he drinks about 1.5 oz of alcohol per week. He reports that he does not use illicit drugs.   Family History:  The patient's family history includes Atrial fibrillation in his father; Emphysema in his paternal grandfather; Heart disease in his father; Hypertension in his father; Stroke in his father.   ROS:  Please see the history of present illness.  No nausea, vomiting.  No fevers, chills.  No focal weakness.  No dysuria.    All other systems reviewed and negative.   PHYSICAL EXAM: VS:  BP 118/92 mmHg  Pulse 63  Ht 6\' 1"  (1.854 m)  Wt 162 lb (73.483 kg)  BMI 21.38 kg/m2 General: Well developed, well nourished, in no acute distress HEENT: normal Neck: no JVD, no carotid bruits Cardiac:  normal S1, S2; RRR;  Lungs:  clear to auscultation bilaterally, no wheezing, rhonchi or  rales Abd: soft, nontender, no hepatomegaly Ext: no edema Skin: warm and dry Neuro:   no focal abnormalities noted Psych: normal affect     ASSESSMENT AND PLAN:  1. Palpitations: Decreased in severity. The only monitor he can use while swimming would be an implanted LINQ monitor.  His symptoms are not that severe at this point. If he has any lightheadedness or syncope, he will let us know. If his palpitations interfere with his activities, he will let us know. At a high level of exertion on the treadmill, he had no problems and no significant arrhythmias. I think a 30 day event monitor would be low yield. His cardiac exam is normal. I will see him back as needed.  Signed, Fredric MareJay S. Cleatus Goodin, MD, [facility] 10/26/2014 5:03 PM

## 2014-10-26 NOTE — Patient Instructions (Signed)
Your physician recommends that you schedule a follow-up appointment in: AS NEEDED  NO CHANGES WERE MADE TODAY  

## 2014-10-30 ENCOUNTER — Ambulatory Visit (INDEPENDENT_AMBULATORY_CARE_PROVIDER_SITE_OTHER): Payer: BC Managed Care – PPO | Admitting: Internal Medicine

## 2014-10-30 ENCOUNTER — Encounter: Payer: Self-pay | Admitting: Internal Medicine

## 2014-10-30 VITALS — BP 110/70 | HR 66 | Ht 71.75 in | Wt 162.0 lb

## 2014-10-30 DIAGNOSIS — K648 Other hemorrhoids: Secondary | ICD-10-CM

## 2014-10-30 DIAGNOSIS — K625 Hemorrhage of anus and rectum: Secondary | ICD-10-CM

## 2014-10-30 NOTE — Patient Instructions (Signed)
You have been scheduled for a flexible sigmoidoscopy. Please follow the written instructions given to you at your visit today. If you use inhalers (even only as needed), please bring them with you on the day of your procedure.   I appreciate the opportunity to care for you.  

## 2014-10-30 NOTE — Progress Notes (Signed)
  Referred by Gershon CraneStephen Fry, MD Subjective:    Patient ID: Darren BaileyMark Willmon, male    DOB: [birth year], 33 y.o.   MRN: [id number]  HPI The patient is a pleasant middle-aged wm with a long hx of intermittent rectal bleeding. Started about age 89. Occurs with wiping , streaked on stool and can be in commode. No constipation issues if he uses fiber supplements. Concerned because a friend only two years older was diagnosed with rectal cancer this year.  He can go long periods of time w/o bleeding and has not bled in 2 months .  He runs, lifts weights.  Medications, allergies, past medical history, past surgical history, family history and social history are reviewed and updated in the EMR.  Review of Systems As per HPI, all other ROS negative    Objective:   Physical Exam General:  Well-developed, well-nourished and in no acute distress Eyes:  anicteric. ENT:   Mouth and posterior pharynx free of lesions.  Neck:   supple w/o thyromegaly or mass.  Lungs: Clear to auscultation bilaterally. Heart:  S1S2, no rubs, murmurs, gallops. Abdomen:  soft, non-tender, no hepatosplenomegaly, hernia, or mass and BS+.  Rectal: A few small fleshy tags    Mild anal stenosis, non -tender no stool or mass  Anoscopy - Gr 1-2 internal hemorrhoids all positions  Lymph:  no cervical or supraclavicular adenopathy. Extremities:   no edema Skin   no rash. Neuro:  A&O x 3.  Psych:  appropriate mood and  Affect.   Data Reviewed: Lab Results  Component Value Date   WBC 4.7 08/19/2013   HGB 14.8 08/19/2013   HCT 43.7 08/19/2013   MCV 94.6 08/19/2013   PLT 186.0 08/19/2013      Assessment & Plan:  Rectal bleeding  Bleeding internal hemorrhoids   1) Flex sig to evaluate and be more certain that hemorrhoids only cause of bleeding 2) Consider hemorrhoidal banding 3) The risks and benefits as well as alternatives of endoscopic procedure(s) have been discussed and reviewed. All questions answered. The patient  agrees to proceed.   I appreciate the opportunity to care for this patient.  Cc:FRY,STEPHEN A, MD

## 2014-11-18 ENCOUNTER — Encounter: Payer: Self-pay | Admitting: Internal Medicine

## 2014-12-18 ENCOUNTER — Ambulatory Visit (AMBULATORY_SURGERY_CENTER): Payer: BLUE CROSS/BLUE SHIELD | Admitting: Internal Medicine

## 2014-12-18 ENCOUNTER — Encounter: Payer: Self-pay | Admitting: Internal Medicine

## 2014-12-18 VITALS — BP 115/78 | HR 52 | Temp 96.6°F | Resp 18 | Ht 71.75 in | Wt 162.0 lb

## 2014-12-18 DIAGNOSIS — K625 Hemorrhage of anus and rectum: Secondary | ICD-10-CM

## 2014-12-18 DIAGNOSIS — K648 Other hemorrhoids: Secondary | ICD-10-CM

## 2014-12-18 HISTORY — PX: FLEXIBLE SIGMOIDOSCOPY: SHX1649

## 2014-12-18 MED ORDER — SODIUM CHLORIDE 0.9 % IV SOLN: 500.0000 mL | INTRAVENOUS | Status: DC

## 2014-12-18 NOTE — Patient Instructions (Addendum)
The bleeding is from hemorrhoids. I can band these and provide long-term relief if you'd like. Just call us and we will schedule an appointment.  I appreciate the opportunity to care for you. Iva Boop, MD, FACG  YOU HAD AN ENDOSCOPIC PROCEDURE TODAY AT THE [facility]: Refer to the procedure report that was given to you for any specific questions about what was found during the examination.  If the procedure report does not answer your questions, please call your gastroenterologist to clarify.  If you requested that your care partner not be given the details of your procedure findings, then the procedure report has been included in a sealed envelope for you to review at your convenience later.  YOU SHOULD EXPECT: Some feelings of bloating in the abdomen. Passage of more gas than usual.  Walking can help get rid of the air that was put into your GI tract during the procedure and reduce the bloating. If you had a lower endoscopy (such as a colonoscopy or flexible sigmoidoscopy) you may notice spotting of blood in your stool or on the toilet paper. If you underwent a bowel prep for your procedure, then you may not have a normal bowel movement for a few days.  DIET: Your first meal following the procedure should be a light meal and then it is ok to progress to your normal diet.  A half-sandwich or bowl of soup is an example of a good first meal.  Heavy or fried foods are harder to digest and may make you feel nauseous or bloated.  Likewise meals heavy in dairy and vegetables can cause extra gas to form and this can also increase the bloating.  Drink plenty of fluids but you should avoid alcoholic beverages for 24 hours.  ACTIVITY: Your care partner should take you home directly after the procedure.  You should plan to take it easy, moving slowly for the rest of the day.  You can resume normal activity the day after the procedure however you should NOT DRIVE or use heavy  machinery for 24 hours (because of the sedation medicines used during the test).    SYMPTOMS TO REPORT IMMEDIATELY: A gastroenterologist can be reached at any hour.  During normal business hours, 8:30 AM to 5:00 PM Monday through Friday, call [phone].  After hours and on weekends, please call the GI answering service at [phone] who will take a message and have the physician on call contact you.   Following lower endoscopy (colonoscopy or flexible sigmoidoscopy):  Excessive amounts of blood in the stool  Significant tenderness or worsening of abdominal pains  Swelling of the abdomen that is new, acute  Fever of 100F or higher   FOLLOW UP: If any biopsies were taken you will be contacted by phone or by letter within the next 1-3 weeks.  Call your gastroenterologist if you have not heard about the biopsies in 3 weeks.  Our staff will call the home number listed on your records the next business day following your procedure to check on you and address any questions or concerns that you may have at that time regarding the information given to you following your procedure. This is a courtesy call and so if there is no answer at the home number and we have not heard from you through the emergency physician on call, we will assume that you have returned to your regular daily activities without incident.  SIGNATURES/CONFIDENTIALITY: You and/or your care partner have  signed paperwork which will be entered into your electronic medical record.  These signatures attest to the fact that that the information above on your After Visit Summary has been reviewed and is understood.  Full responsibility of the confidentiality of this discharge information lies with you and/or your care-partner.   Hemorrhoid information given.

## 2014-12-18 NOTE — Op Note (Signed)
[facility] [address] [place], [id number]   FLEXIBLE SIGMOIDOSCOPY PROCEDURE REPORT  PATIENT: Darren Mejia, Darren Mejia  MR#: [id number] BIRTHDATE: May 28, 1981 , 33  yrs. old GENDER: male ENDOSCOPIST: Iva Booparl E Gessner, MD, [facility] PROCEDURE DATE:  12/18/2014 PROCEDURE:   Sigmoidoscopy, diagnostic ASA CLASS:   Class II INDICATIONS:rectal bleeding. MEDICATIONS: Propofol 300 mg IV and Monitored anesthesia care  DESCRIPTION OF PROCEDURE:   After the risks benefits and alternatives of the procedure were thoroughly explained, informed consent was obtained.  Digital exam revealed no abnormalities of the rectum, Digital exam revealed the prostate was not enlarged, and Digital exam revealed no prostatic nodules. The LB PFC-H190 [id number]  endoscope was introduced through the anus  and advanced to the splenic flexure , The exam was Without limitations.    The quality of the prep was The overall prep quality was good. .  The instrument was then slowly withdrawn as the mucosa was fully examined.         COLON FINDINGS: The colonic mucosa appeared normal - exam to splenic flexure.    Retroflexed views revealed internal hemorrhoids.    The scope was then withdrawn from the patient and the procedure terminated.  COMPLICATIONS: There were no immediate complications.  ENDOSCOPIC IMPRESSION: 1.   The colonic mucosa appeared normal - to splenic flexure 2.   Internal hemorrhoids  RECOMMENDATIONS: Continue fiber I can band the hemorrhoids if desired    eSigned:  Iva Booparl E Gessner, MD, [facility] 12/18/2014 2:50 PM   CC: The Patient

## 2014-12-21 ENCOUNTER — Telehealth: Payer: Self-pay | Admitting: *Deleted

## 2014-12-21 NOTE — Telephone Encounter (Signed)
  Follow up Call-  Call back number 12/18/2014  Post procedure Call Back phone  # [phone]  Permission to leave phone message Yes     Patient questions:  Do you have a fever, pain , or abdominal swelling? No. Pain Score  0 *  Have you tolerated food without any problems? Yes.    Have you been able to return to your normal activities? Yes.    Do you have any questions about your discharge instructions: Diet   No. Medications  No. Follow up visit  No.  Do you have questions or concerns about your Care? No.  Actions: * If pain score is 4 or above: No action needed, pain <4.

## 2015-01-19 ENCOUNTER — Telehealth: Payer: Self-pay | Admitting: Pulmonary Disease

## 2015-01-19 NOTE — Telephone Encounter (Signed)
Called spoke with pt. He reports KC gave him 2 names of a CPAP machine (models) to look into. He could not recall the names. Please advise KC thanks

## 2015-01-20 NOTE — Telephone Encounter (Signed)
[facility] s10 auto device respironics dreamstation Will need heated humidifier and climate control tubing with either device.

## 2015-01-20 NOTE — Telephone Encounter (Signed)
lmomtcb for pt 

## 2015-01-21 NOTE — Telephone Encounter (Signed)
Spoke with pt.  Provided below information to pt.  He verbalized understanding and voiced no further questions or concerns at this time.

## 2015-01-29 ENCOUNTER — Ambulatory Visit (INDEPENDENT_AMBULATORY_CARE_PROVIDER_SITE_OTHER): Payer: BLUE CROSS/BLUE SHIELD | Admitting: Family Medicine

## 2015-01-29 ENCOUNTER — Encounter: Payer: Self-pay | Admitting: Family Medicine

## 2015-01-29 VITALS — BP 111/69 | HR 59 | Temp 98.5°F | Ht 71.75 in | Wt 163.0 lb

## 2015-01-29 DIAGNOSIS — B9789 Other viral agents as the cause of diseases classified elsewhere: Principal | ICD-10-CM

## 2015-01-29 DIAGNOSIS — J069 Acute upper respiratory infection, unspecified: Secondary | ICD-10-CM

## 2015-01-29 NOTE — Progress Notes (Signed)
   Subjective:    Patient ID: Darren Mejia, male    DOB: Dec 11, 1981, 34 y.o.   MRN: [id number]  HPI Here for 3 days of mild sharp pain in the right lower ribs after coughing a lot. About 10 days ago he developed a dry hard cough that persisted until yesterday. No fever, no sinus symptoms. Today he feels better. Using Tylenol Cold and Cough.    Review of Systems  Constitutional: Negative.   HENT: Negative.   Eyes: Negative.   Respiratory: Positive for cough. Negative for chest tightness, shortness of breath and wheezing.   Cardiovascular: Positive for chest pain. Negative for palpitations and leg swelling.       Objective:   Physical Exam  Constitutional: He appears well-developed and well-nourished. No distress.  HENT:  Right Ear: External ear normal.  Left Ear: External ear normal.  Nose: Nose normal.  Mouth/Throat: Oropharynx is clear and moist.  Eyes: Conjunctivae are normal.  Cardiovascular: Normal rate, regular rhythm, normal heart sounds and intact distal pulses.   Pulmonary/Chest: Effort normal and breath sounds normal. No respiratory distress. He has no wheezes. He has no rales.  Tender over the lower right lateral ribs, no crepitus   Lymphadenopathy:    He has no cervical adenopathy.          Assessment & Plan:  He is almost over a viral URI, and he has strained an intercostal muscle from the coughing. This should resolve quickly now that the coughing has improved. Recheck prn

## 2015-01-29 NOTE — Progress Notes (Signed)
Pre visit review using our clinic review tool, if applicable. No additional management support is needed unless otherwise documented below in the visit note. 

## 2015-03-25 ENCOUNTER — Telehealth: Payer: Self-pay | Admitting: Pulmonary Disease

## 2015-03-25 DIAGNOSIS — G4733 Obstructive sleep apnea (adult) (pediatric): Secondary | ICD-10-CM

## 2015-03-25 NOTE — Telephone Encounter (Signed)
lmomtcb x1 

## 2015-03-26 NOTE — Telephone Encounter (Signed)
lmtcb

## 2015-03-26 NOTE — Telephone Encounter (Signed)
[phone], pt wife cb, she is a Runner, broadcasting/film/videoteacher so she can not answer until after 3pm

## 2015-03-26 NOTE — Telephone Encounter (Signed)
lmtcb X 2 

## 2015-03-29 NOTE — Telephone Encounter (Signed)
lmtcb x2 for pt's wife. 

## 2015-03-30 NOTE — Telephone Encounter (Signed)
LM x 3 for wife

## 2015-03-31 NOTE — Telephone Encounter (Signed)
Unable to reach wife at number listed - left multiple messages to return call Called pt on his mobile number, LMTCB x 1 Will hold in triage to try call again after 3pm as mentioned in early messages as wife is a Runner, broadcasting/film/videoteacher.

## 2015-04-01 NOTE — Telephone Encounter (Signed)
Pt wife returning call said that she wouldn't be avail til after 3, she is a Runner, broadcasting/film/videoteacher and can be reached @ [phone].Caren GriffinsStanley A Dalton

## 2015-04-01 NOTE — Telephone Encounter (Signed)
Left message on patient's voice mail requesting call back

## 2015-04-01 NOTE — Telephone Encounter (Signed)
Spoke with pt wife Maralyn SagoSarah--  Wife Maralyn Sago(Sarah) states that the patient is needing a new CPAP machine and does not have a DME Pt gets machine and supplies through [facility] currently.  Current machine is not working properly, plug keeps coming unplugged from machine during the night and they are having to tape the plug in place.  Machine is also beeping all throughout the night. Pt wife is not sure of what pressure setting his current machine is set on.   They are wanting to look into getting the Dream Station AUTO CPAP machine - Maralyn SagoSarah states that this was discussed at one time with Dr Shelle Ironlance in office.   Please advise Dr Shelle Ironlance. Thanks.

## 2015-04-02 NOTE — Telephone Encounter (Signed)
lmomtcb x1 for Maralyn SagoSarah

## 2015-04-02 NOTE — Telephone Encounter (Signed)
Ok with me.  He has to decide how he wants to pay for it.  Thru insurance with dme, on line, out of pocket, etc.  Order: respironics dream station with h/h and climate control tubing.  Set on auto 5-20cm.  Enroll in [place] everywhere.

## 2015-04-02 NOTE — Telephone Encounter (Signed)
Call wife after 3PM as she is a Runner, broadcasting/film/videoteacher.

## 2015-04-05 NOTE — Telephone Encounter (Signed)
Pt's wife is aware that [facility] is okay with ordering CPAP. Order has been placed to go through DME. Nothing further was needed.

## 2015-04-06 ENCOUNTER — Telehealth: Payer: Self-pay | Admitting: Pulmonary Disease

## 2015-04-06 NOTE — Telephone Encounter (Signed)
I called spoke with pt spouse. She reports pt has been with [facility] and would like to stick with them. She wants order sent to them. The phone # is [phone]. [facility] Please advise PCC's thanks

## 2015-04-07 NOTE — Telephone Encounter (Signed)
Order refaxed to [facility] [employer] [employer]

## 2015-04-08 ENCOUNTER — Telehealth: Payer: Self-pay | Admitting: Pulmonary Disease

## 2015-04-08 NOTE — Telephone Encounter (Signed)
Sent melissa a message @[facility]  [employer]

## 2015-04-08 NOTE — Telephone Encounter (Signed)
The PATIENT requested this machine specifically.  It will need to be his decision, and he should be give the opportunity to get elsewhere or on line.

## 2015-04-08 NOTE — Telephone Encounter (Signed)
Spoke with pt .  He states that he decided to go with [facility] instead of [facility] if [facility] is able to provide him with the dreamstation cpap.  [facility] to see what they need to do regarding orders.

## 2015-04-08 NOTE — Telephone Encounter (Signed)
Called and spoke to Robin at [facility]. They do not carry respironics dreamstation cpap. Zella BallRobin is questioning if KC would be ok with the CPAP being changed to [facility] air sense 10.  KC please advise.

## 2015-04-13 NOTE — Telephone Encounter (Signed)
Darren Mejia have you heard anything back yet? thanks

## 2015-04-14 NOTE — Telephone Encounter (Signed)
Someone from [facility] has spoken to this pt and he is aware they can not take him he is going to stay with his current company [employer]

## 2015-05-27 ENCOUNTER — Telehealth: Payer: Self-pay | Admitting: Pulmonary Disease

## 2015-05-27 DIAGNOSIS — G4733 Obstructive sleep apnea (adult) (pediatric): Secondary | ICD-10-CM

## 2015-05-27 NOTE — Telephone Encounter (Signed)
Pt and wife aware.  Nothing further needed.

## 2015-05-27 NOTE — Telephone Encounter (Signed)
Spoke with pt, states that back in April an order for new cpap was ordered but pt preferred to try and have current cpap repaired before trying to get a new one.  Pt took cpap to be repaired but was told it could not be repaired, and that a new order for new cpap needed to be resent.  Pt is requesting an Airsense 10.    Pt was not established with another provider since [facility] announced his leave.  VS are you ok with signing this new cpap order for pt?  Thanks!

## 2015-05-27 NOTE — Telephone Encounter (Signed)
Pt wife calling back.Darren Mejia ° ° °

## 2015-05-27 NOTE — Telephone Encounter (Signed)
Order for replacement cpap placed.  LMTC to make pt aware this has been done.

## 2015-05-27 NOTE — Telephone Encounter (Signed)
lmtcb X2 to make pt's spouse aware of requested order being placed.

## 2015-05-27 NOTE — Telephone Encounter (Signed)
[phone], Darren Mejia

## 2015-05-27 NOTE — Telephone Encounter (Signed)
Return call.Darren Mejia °

## 2015-05-27 NOTE — Telephone Encounter (Signed)
Okay to send order. 

## 2015-06-09 ENCOUNTER — Telehealth: Payer: Self-pay | Admitting: Pulmonary Disease

## 2015-06-09 NOTE — Telephone Encounter (Signed)
Patient's wife, Maralyn SagoSarah, returned call.  Please return call at [phone].

## 2015-06-09 NOTE — Telephone Encounter (Signed)
Spoke with pt's wife, states that cpap order was sent to [facility].   Pt needs an ov to say that he still has sleep apnea, his current cpap is broken, and he is compliant.  A face to face visit with a provider is needed, a letter won't suffice. VS is booked out through his laid out schedule. Scheduled for next available with TP at pt's wife's request.  Nothing further needed at this time.

## 2015-06-09 NOTE — Telephone Encounter (Signed)
lmtcb X1 for pt's wife.  

## 2015-06-18 ENCOUNTER — Telehealth: Payer: Self-pay | Admitting: Pulmonary Disease

## 2015-06-18 NOTE — Telephone Encounter (Signed)
Called spoke with Dianne from [facility]. She reports when pt comes in to see TP next week, she needs the following documented in chart notes: 1) if pt is using machine? 2) is pt benefiting CPAP machine? 3) pt is requesting a new CPAP and needs it noted why pt is wanting a new cpap 4) if patient symptoms have improved on CPAP  Will forward to [place] to make aware.

## 2015-06-22 ENCOUNTER — Encounter: Payer: Self-pay | Admitting: Adult Health

## 2015-06-22 ENCOUNTER — Ambulatory Visit (INDEPENDENT_AMBULATORY_CARE_PROVIDER_SITE_OTHER): Payer: BLUE CROSS/BLUE SHIELD | Admitting: Adult Health

## 2015-06-22 VITALS — BP 118/76 | HR 53 | Temp 98.0°F | Ht 72.0 in | Wt 163.0 lb

## 2015-06-22 DIAGNOSIS — G4733 Obstructive sleep apnea (adult) (pediatric): Secondary | ICD-10-CM

## 2015-06-22 NOTE — Assessment & Plan Note (Signed)
He benefits from nightly CPAP therapy w/ less daytime sleepiness.  Machine needs to be replaced because it is malfunctioning and cannot be repaired .  It is 7331years old.   New order sent to DME   Plan  Will send order for new CPAP to DME  Do not drive is sleepy.  Wear CPAP everynight.  Download in 1 month after new machine is started.  follow up Dr. Craige CottaSood  In 1 year and As needed

## 2015-06-22 NOTE — Progress Notes (Signed)
   Subjective:    Patient ID: Darren Mejia, male    DOB: 30-Mar-1981, 34 y.o.   MRN: [id number]  HPI 34 yo male with OSA on nocturnal CPAP   06/22/2015 Follow up OSA  Pt returns for follow up for OSA.  He wears CPAP for around 8 hrs each night.  Feels rested with less daytime sleepiness.  Over last 2 month machine is not working.  Took into DME company ,could not be fixed.  He is more symptomatic with daytime sleepiness .  No chest pain, orthopnea, edema.  Unable to download card today as full.    Review of Systems Constitutional:   No  weight loss, night sweats,  Fevers, chills, fatigue, or  lassitude.  HEENT:   No headaches,  Difficulty swallowing,  Tooth/dental problems, or  Sore throat,                No sneezing, itching, ear ache, nasal congestion, post nasal drip,   CV:  No chest pain,  Orthopnea, PND, swelling in lower extremities, anasarca, dizziness, palpitations, syncope.   GI  No heartburn, indigestion, abdominal pain, nausea, vomiting, diarrhea, change in bowel habits, loss of appetite, bloody stools.   Resp: No shortness of breath with exertion or at rest.  No excess mucus, no productive cough,  No non-productive cough,  No coughing up of blood.  No change in color of mucus.  No wheezing.  No chest wall deformity  Skin: no rash or lesions.  GU: no dysuria, change in color of urine, no urgency or frequency.  No flank pain, no hematuria   MS:  No joint pain or swelling.  No decreased range of motion.  No back pain.  Psych:  No change in mood or affect. No depression or anxiety.  No memory loss.         Objective:   Physical Exam GEN: A/Ox3; pleasant , NAD, well nourished   HEENT:  [place]/AT,  EACs-clear, TMs-wnl, NOSE-clear, THROAT-clear, no lesions, no postnasal drip or exudate noted. Class 2 airway   NECK:  Supple w/ fair ROM; no JVD; normal carotid impulses w/o bruits; no thyromegaly or nodules palpated; no lymphadenopathy.  RESP  Clear  P & A; w/o, wheezes/  rales/ or rhonchi.no accessory muscle use, no dullness to percussion  CARD:  RRR, no m/r/g  , no peripheral edema, pulses intact, no cyanosis or clubbing.  GI:   Soft & nt; nml bowel sounds; no organomegaly or masses detected.  Musco: Warm bil, no deformities or joint swelling noted.   Neuro: alert, no focal deficits noted.    Skin: Warm, no lesions or rashes         Assessment & Plan:

## 2015-06-22 NOTE — Progress Notes (Signed)
Reviewed and agree with assessment/plan. 

## 2015-06-22 NOTE — Addendum Note (Signed)
Addended by: York RamGAY, Wenceslao Loper on: 06/22/2015 03:40 PM   Modules accepted: Orders

## 2015-06-22 NOTE — Patient Instructions (Signed)
Will send order for new CPAP to DME  Do not drive is sleepy.  Wear CPAP everynight.  Download in 1 month after new machine is started.  follow up Dr. Craige CottaSood  In 1 year and As needed

## 2015-06-22 NOTE — Telephone Encounter (Signed)
Patient came to appointment to see TP today TP completed requirements per insurance request.  AVS was faxed to Diane at [phone] per pt and TP's request. Nothing further needed.

## 2015-06-22 NOTE — Addendum Note (Signed)
Addended by: York RamGAY, Constantin Hillery on: 06/22/2015 03:35 PM   Modules accepted: Orders

## 2015-07-14 ENCOUNTER — Other Ambulatory Visit: Payer: Self-pay | Admitting: Family Medicine

## 2015-08-10 ENCOUNTER — Telehealth: Payer: Self-pay | Admitting: Pulmonary Disease

## 2015-08-10 NOTE — Telephone Encounter (Signed)
Auto CPAP 07/10/15 to 08/08/15 >> used on 27 of 30 nights with average 9 hrs and 4 min.  Average AHI is 0.5 with median CPAP 6 cm H2O and 95 th percentile CPAP 8 cm H20.   Will have my nurse inform pt that CPAP report looks good.  No change to current set up needed.

## 2015-08-11 NOTE — Telephone Encounter (Signed)
I spoke with patient about results and he verbalized understanding and had no questions 

## 2015-09-17 ENCOUNTER — Ambulatory Visit: Payer: BC Managed Care – PPO | Admitting: Pulmonary Disease

## 2015-10-15 ENCOUNTER — Other Ambulatory Visit: Payer: Self-pay | Admitting: Family Medicine

## 2016-01-25 ENCOUNTER — Other Ambulatory Visit: Payer: Self-pay | Admitting: Family Medicine

## 2016-04-26 ENCOUNTER — Other Ambulatory Visit: Payer: Self-pay | Admitting: Family Medicine

## 2016-05-24 DIAGNOSIS — M542 Cervicalgia: Secondary | ICD-10-CM | POA: Diagnosis not present

## 2016-05-24 DIAGNOSIS — M545 Low back pain: Secondary | ICD-10-CM | POA: Diagnosis not present

## 2016-05-25 DIAGNOSIS — G4733 Obstructive sleep apnea (adult) (pediatric): Secondary | ICD-10-CM | POA: Diagnosis not present

## 2016-06-09 DIAGNOSIS — M542 Cervicalgia: Secondary | ICD-10-CM | POA: Diagnosis not present

## 2016-06-09 DIAGNOSIS — M546 Pain in thoracic spine: Secondary | ICD-10-CM | POA: Diagnosis not present

## 2016-07-06 DIAGNOSIS — M546 Pain in thoracic spine: Secondary | ICD-10-CM | POA: Diagnosis not present

## 2016-07-06 DIAGNOSIS — M542 Cervicalgia: Secondary | ICD-10-CM | POA: Diagnosis not present

## 2016-07-06 DIAGNOSIS — M545 Low back pain: Secondary | ICD-10-CM | POA: Diagnosis not present

## 2016-07-07 ENCOUNTER — Other Ambulatory Visit: Payer: Self-pay | Admitting: Family Medicine

## 2016-07-26 DIAGNOSIS — L814 Other melanin hyperpigmentation: Secondary | ICD-10-CM | POA: Diagnosis not present

## 2016-07-26 DIAGNOSIS — D1801 Hemangioma of skin and subcutaneous tissue: Secondary | ICD-10-CM | POA: Diagnosis not present

## 2016-07-26 DIAGNOSIS — D225 Melanocytic nevi of trunk: Secondary | ICD-10-CM | POA: Diagnosis not present

## 2016-07-26 DIAGNOSIS — L700 Acne vulgaris: Secondary | ICD-10-CM | POA: Diagnosis not present

## 2016-07-28 ENCOUNTER — Other Ambulatory Visit: Payer: Self-pay | Admitting: Family Medicine

## 2016-07-31 NOTE — Telephone Encounter (Signed)
Okay for refill?  

## 2016-08-02 DIAGNOSIS — M542 Cervicalgia: Secondary | ICD-10-CM | POA: Diagnosis not present

## 2016-08-02 DIAGNOSIS — M546 Pain in thoracic spine: Secondary | ICD-10-CM | POA: Diagnosis not present

## 2016-09-05 DIAGNOSIS — G4733 Obstructive sleep apnea (adult) (pediatric): Secondary | ICD-10-CM | POA: Diagnosis not present

## 2016-09-06 DIAGNOSIS — M542 Cervicalgia: Secondary | ICD-10-CM | POA: Diagnosis not present

## 2016-09-06 DIAGNOSIS — M546 Pain in thoracic spine: Secondary | ICD-10-CM | POA: Diagnosis not present

## 2016-09-06 DIAGNOSIS — M79605 Pain in left leg: Secondary | ICD-10-CM | POA: Diagnosis not present

## 2016-09-26 ENCOUNTER — Other Ambulatory Visit (INDEPENDENT_AMBULATORY_CARE_PROVIDER_SITE_OTHER): Payer: BLUE CROSS/BLUE SHIELD

## 2016-09-26 DIAGNOSIS — Z0000 Encounter for general adult medical examination without abnormal findings: Secondary | ICD-10-CM | POA: Diagnosis not present

## 2016-09-26 LAB — LIPID PANEL
CHOL/HDL RATIO: 3
Cholesterol: 167 mg/dL (ref 0–200)
HDL: 57.4 mg/dL (ref >39.00)
LDL Cholesterol: 97 mg/dL (ref 0–99)
NONHDL: 109.23
Triglycerides: 62 mg/dL (ref 0.0–149.0)
VLDL: 12.4 mg/dL (ref 0.0–40.0)

## 2016-09-26 LAB — CBC WITH DIFFERENTIAL/PLATELET
BASOS ABS: 0 10*3/uL (ref 0.0–0.1)
Basophils Relative: 0.5 % (ref 0.0–3.0)
Eosinophils Absolute: 0.1 10*3/uL (ref 0.0–0.7)
Eosinophils Relative: 1.3 % (ref 0.0–5.0)
HCT: 43.7 % (ref 39.0–52.0)
Hemoglobin: 15 g/dL (ref 13.0–17.0)
LYMPHS ABS: 2 10*3/uL (ref 0.7–4.0)
Lymphocytes Relative: 44.6 % (ref 12.0–46.0)
MCHC: 34.2 g/dL (ref 30.0–36.0)
MCV: 94.2 fl (ref 78.0–100.0)
MONO ABS: 0.3 10*3/uL (ref 0.1–1.0)
MONOS PCT: 6.6 % (ref 3.0–12.0)
NEUTROS ABS: 2.1 10*3/uL (ref 1.4–7.7)
NEUTROS PCT: 47 % (ref 43.0–77.0)
PLATELETS: 160 10*3/uL (ref 150.0–400.0)
RBC: 4.64 Mil/uL (ref 4.22–5.81)
RDW: 13.2 % (ref 11.5–15.5)
WBC: 4.5 10*3/uL (ref 4.0–10.5)

## 2016-09-26 LAB — BASIC METABOLIC PANEL
BUN: 14 mg/dL (ref 6–23)
CALCIUM: 9.6 mg/dL (ref 8.4–10.5)
CO2: 30 meq/L (ref 19–32)
CREATININE: 0.94 mg/dL (ref 0.40–1.50)
Chloride: 103 mEq/L (ref 96–112)
GFR: 96.06 mL/min (ref >60.00)
GLUCOSE: 89 mg/dL (ref 70–99)
Potassium: 4.2 mEq/L (ref 3.5–5.1)
SODIUM: 140 meq/L (ref 135–145)

## 2016-09-26 LAB — POC URINALSYSI DIPSTICK (AUTOMATED)
Bilirubin, UA: NEGATIVE
GLUCOSE UA: NEGATIVE
Ketones, UA: NEGATIVE
Leukocytes, UA: NEGATIVE
Nitrite, UA: NEGATIVE
Protein, UA: NEGATIVE
RBC UA: NEGATIVE
SPEC GRAV UA: 1.01
UROBILINOGEN UA: 0.2
pH, UA: 7.5

## 2016-09-26 LAB — HEPATIC FUNCTION PANEL
ALBUMIN: 4.7 g/dL (ref 3.5–5.2)
ALK PHOS: 43 U/L (ref 39–117)
ALT: 19 U/L (ref 0–53)
AST: 30 U/L (ref 0–37)
Bilirubin, Direct: 0.2 mg/dL (ref 0.0–0.3)
Total Bilirubin: 1.1 mg/dL (ref 0.2–1.2)
Total Protein: 7.5 g/dL (ref 6.0–8.3)

## 2016-09-26 LAB — TSH: TSH: 1.39 u[IU]/mL (ref 0.35–4.50)

## 2016-10-06 ENCOUNTER — Ambulatory Visit (INDEPENDENT_AMBULATORY_CARE_PROVIDER_SITE_OTHER): Payer: BLUE CROSS/BLUE SHIELD | Admitting: Family Medicine

## 2016-10-06 ENCOUNTER — Encounter: Payer: Self-pay | Admitting: Family Medicine

## 2016-10-06 VITALS — BP 119/71 | HR 59 | Temp 98.0°F | Ht 72.0 in | Wt 165.0 lb

## 2016-10-06 DIAGNOSIS — Z0000 Encounter for general adult medical examination without abnormal findings: Secondary | ICD-10-CM

## 2016-10-06 DIAGNOSIS — Z23 Encounter for immunization: Secondary | ICD-10-CM | POA: Diagnosis not present

## 2016-10-06 MED ORDER — ESCITALOPRAM OXALATE 10 MG PO TABS: 10.0000 mg | ORAL_TABLET | Freq: Every day | ORAL | 3 refills | Status: DC

## 2016-10-06 NOTE — Progress Notes (Signed)
   Subjective:    Patient ID: Darren BaileyMark Mejia, male    DOB: 1981/09/27, 35 y.o.   MRN: [id number]  HPI 35 yr old male for a well exam. He is doing well and still exercises every day. He does ask if the Lexapro dose could be increased to 10 mg daily.    Review of Systems  Constitutional: Negative.   HENT: Negative.   Eyes: Negative.   Respiratory: Negative.   Cardiovascular: Negative.   Gastrointestinal: Negative.   Genitourinary: Negative.   Musculoskeletal: Negative.   Skin: Negative.   Neurological: Negative.   Psychiatric/Behavioral: Negative.        Objective:   Physical Exam  Constitutional: He is oriented to person, place, and time. He appears well-developed and well-nourished. No distress.  HENT:  Head: Normocephalic and atraumatic.  Right Ear: External ear normal.  Left Ear: External ear normal.  Nose: Nose normal.  Mouth/Throat: Oropharynx is clear and moist. No oropharyngeal exudate.  Eyes: Conjunctivae and EOM are normal. Pupils are equal, round, and reactive to light. Right eye exhibits no discharge. Left eye exhibits no discharge. No scleral icterus.  Neck: Neck supple. No JVD present. No tracheal deviation present. No thyromegaly present.  Cardiovascular: Normal rate, regular rhythm, normal heart sounds and intact distal pulses.  Exam reveals no gallop and no friction rub.   No murmur heard. Pulmonary/Chest: Effort normal and breath sounds normal. No respiratory distress. He has no wheezes. He has no rales. He exhibits no tenderness.  Abdominal: Soft. Bowel sounds are normal. He exhibits no distension and no mass. There is no tenderness. There is no rebound and no guarding.  Genitourinary: Penis normal. No penile tenderness.  Musculoskeletal: Normal range of motion. He exhibits no edema or tenderness.  Lymphadenopathy:    He has no cervical adenopathy.  Neurological: He is alert and oriented to person, place, and time. He has normal reflexes. No cranial nerve  deficit. He exhibits normal muscle tone. Coordination normal.  Skin: Skin is warm and dry. No rash noted. He is not diaphoretic. No erythema. No pallor.  Psychiatric: He has a normal mood and affect. His behavior is normal. Judgment and thought content normal.          Assessment & Plan:  Well exam. We discussed diet and exercise. Increase Lexapro to 10 mg daily.  Nelwyn SalisburyFRY,Jerene Yeager A, MD

## 2016-10-06 NOTE — Progress Notes (Signed)
Pre visit review using our clinic review tool, if applicable. No additional management support is needed unless otherwise documented below in the visit note. 

## 2016-10-17 DIAGNOSIS — H35413 Lattice degeneration of retina, bilateral: Secondary | ICD-10-CM | POA: Diagnosis not present

## 2016-11-01 DIAGNOSIS — M545 Low back pain: Secondary | ICD-10-CM | POA: Diagnosis not present

## 2016-11-01 DIAGNOSIS — M25551 Pain in right hip: Secondary | ICD-10-CM | POA: Diagnosis not present

## 2016-11-01 DIAGNOSIS — M546 Pain in thoracic spine: Secondary | ICD-10-CM | POA: Diagnosis not present

## 2016-11-01 DIAGNOSIS — M542 Cervicalgia: Secondary | ICD-10-CM | POA: Diagnosis not present

## 2016-11-29 DIAGNOSIS — M25551 Pain in right hip: Secondary | ICD-10-CM | POA: Diagnosis not present

## 2016-11-29 DIAGNOSIS — M542 Cervicalgia: Secondary | ICD-10-CM | POA: Diagnosis not present

## 2016-11-29 DIAGNOSIS — M546 Pain in thoracic spine: Secondary | ICD-10-CM | POA: Diagnosis not present

## 2017-01-02 DIAGNOSIS — M542 Cervicalgia: Secondary | ICD-10-CM | POA: Diagnosis not present

## 2017-01-02 DIAGNOSIS — M546 Pain in thoracic spine: Secondary | ICD-10-CM | POA: Diagnosis not present

## 2017-01-22 DIAGNOSIS — G4733 Obstructive sleep apnea (adult) (pediatric): Secondary | ICD-10-CM | POA: Diagnosis not present

## 2017-01-30 DIAGNOSIS — M546 Pain in thoracic spine: Secondary | ICD-10-CM | POA: Diagnosis not present

## 2017-01-30 DIAGNOSIS — M542 Cervicalgia: Secondary | ICD-10-CM | POA: Diagnosis not present

## 2017-03-06 DIAGNOSIS — M546 Pain in thoracic spine: Secondary | ICD-10-CM | POA: Diagnosis not present

## 2017-03-06 DIAGNOSIS — M79602 Pain in left arm: Secondary | ICD-10-CM | POA: Diagnosis not present

## 2017-03-06 DIAGNOSIS — M542 Cervicalgia: Secondary | ICD-10-CM | POA: Diagnosis not present

## 2017-04-12 DIAGNOSIS — M79605 Pain in left leg: Secondary | ICD-10-CM | POA: Diagnosis not present

## 2017-04-12 DIAGNOSIS — M545 Low back pain: Secondary | ICD-10-CM | POA: Diagnosis not present

## 2017-04-12 DIAGNOSIS — M25551 Pain in right hip: Secondary | ICD-10-CM | POA: Diagnosis not present

## 2017-04-27 DIAGNOSIS — G4733 Obstructive sleep apnea (adult) (pediatric): Secondary | ICD-10-CM | POA: Diagnosis not present

## 2017-05-09 ENCOUNTER — Telehealth: Payer: Self-pay | Admitting: Family Medicine

## 2017-05-09 NOTE — Telephone Encounter (Signed)
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  And he was wanting to know what he should do for precaution.  Pt would like to have a call back

## 2017-05-09 NOTE — Telephone Encounter (Signed)
Tell him the vaccine he got in 2011 should still be effective to protect him. Otherwise wash hands before and after any direct contact with his children

## 2017-05-10 NOTE — Telephone Encounter (Signed)
I spoke with pt and went over information.  

## 2017-05-22 DIAGNOSIS — M25551 Pain in right hip: Secondary | ICD-10-CM | POA: Diagnosis not present

## 2017-05-22 DIAGNOSIS — M545 Low back pain: Secondary | ICD-10-CM | POA: Diagnosis not present

## 2017-05-22 DIAGNOSIS — M542 Cervicalgia: Secondary | ICD-10-CM | POA: Diagnosis not present

## 2017-05-22 DIAGNOSIS — M546 Pain in thoracic spine: Secondary | ICD-10-CM | POA: Diagnosis not present

## 2017-06-27 DIAGNOSIS — M542 Cervicalgia: Secondary | ICD-10-CM | POA: Diagnosis not present

## 2017-06-27 DIAGNOSIS — M546 Pain in thoracic spine: Secondary | ICD-10-CM | POA: Diagnosis not present

## 2017-06-27 DIAGNOSIS — M545 Low back pain: Secondary | ICD-10-CM | POA: Diagnosis not present

## 2017-07-30 DIAGNOSIS — L814 Other melanin hyperpigmentation: Secondary | ICD-10-CM | POA: Diagnosis not present

## 2017-07-30 DIAGNOSIS — D225 Melanocytic nevi of trunk: Secondary | ICD-10-CM | POA: Diagnosis not present

## 2017-07-30 DIAGNOSIS — L821 Other seborrheic keratosis: Secondary | ICD-10-CM | POA: Diagnosis not present

## 2017-07-30 DIAGNOSIS — D1801 Hemangioma of skin and subcutaneous tissue: Secondary | ICD-10-CM | POA: Diagnosis not present

## 2017-08-08 DIAGNOSIS — G4733 Obstructive sleep apnea (adult) (pediatric): Secondary | ICD-10-CM | POA: Diagnosis not present

## 2017-08-22 DIAGNOSIS — M542 Cervicalgia: Secondary | ICD-10-CM | POA: Diagnosis not present

## 2017-08-22 DIAGNOSIS — M546 Pain in thoracic spine: Secondary | ICD-10-CM | POA: Diagnosis not present

## 2017-08-22 DIAGNOSIS — M545 Low back pain: Secondary | ICD-10-CM | POA: Diagnosis not present

## 2017-08-22 DIAGNOSIS — M25551 Pain in right hip: Secondary | ICD-10-CM | POA: Diagnosis not present

## 2017-09-19 DIAGNOSIS — M542 Cervicalgia: Secondary | ICD-10-CM | POA: Diagnosis not present

## 2017-09-19 DIAGNOSIS — M546 Pain in thoracic spine: Secondary | ICD-10-CM | POA: Diagnosis not present

## 2017-10-08 DIAGNOSIS — Z72820 Sleep deprivation: Secondary | ICD-10-CM | POA: Diagnosis not present

## 2017-10-08 DIAGNOSIS — F5105 Insomnia due to other mental disorder: Secondary | ICD-10-CM | POA: Diagnosis not present

## 2017-10-08 DIAGNOSIS — G4719 Other hypersomnia: Secondary | ICD-10-CM | POA: Diagnosis not present

## 2017-10-08 DIAGNOSIS — G4733 Obstructive sleep apnea (adult) (pediatric): Secondary | ICD-10-CM | POA: Diagnosis not present

## 2017-10-17 DIAGNOSIS — M542 Cervicalgia: Secondary | ICD-10-CM | POA: Diagnosis not present

## 2017-10-17 DIAGNOSIS — M546 Pain in thoracic spine: Secondary | ICD-10-CM | POA: Diagnosis not present

## 2017-10-20 ENCOUNTER — Other Ambulatory Visit: Payer: Self-pay | Admitting: Family Medicine

## 2017-10-22 NOTE — Telephone Encounter (Signed)
Sent to PCP for approval.  

## 2017-11-14 ENCOUNTER — Other Ambulatory Visit: Payer: Self-pay | Admitting: Family Medicine

## 2017-11-15 NOTE — Telephone Encounter (Signed)
Pt is due for an OV for more refills. 

## 2017-11-16 DIAGNOSIS — M545 Low back pain: Secondary | ICD-10-CM | POA: Diagnosis not present

## 2017-11-16 DIAGNOSIS — M79605 Pain in left leg: Secondary | ICD-10-CM | POA: Diagnosis not present

## 2017-11-16 DIAGNOSIS — M546 Pain in thoracic spine: Secondary | ICD-10-CM | POA: Diagnosis not present

## 2017-11-16 DIAGNOSIS — M25551 Pain in right hip: Secondary | ICD-10-CM | POA: Diagnosis not present

## 2017-11-28 DIAGNOSIS — Z3189 Encounter for other procreative management: Secondary | ICD-10-CM | POA: Diagnosis not present

## 2017-12-05 DIAGNOSIS — G4733 Obstructive sleep apnea (adult) (pediatric): Secondary | ICD-10-CM | POA: Diagnosis not present

## 2017-12-20 DIAGNOSIS — M25551 Pain in right hip: Secondary | ICD-10-CM | POA: Diagnosis not present

## 2017-12-20 DIAGNOSIS — M79605 Pain in left leg: Secondary | ICD-10-CM | POA: Diagnosis not present

## 2017-12-20 DIAGNOSIS — M546 Pain in thoracic spine: Secondary | ICD-10-CM | POA: Diagnosis not present

## 2017-12-20 DIAGNOSIS — M545 Low back pain: Secondary | ICD-10-CM | POA: Diagnosis not present

## 2018-01-14 ENCOUNTER — Other Ambulatory Visit: Payer: Self-pay | Admitting: Family Medicine

## 2018-01-14 DIAGNOSIS — Z9989 Dependence on other enabling machines and devices: Secondary | ICD-10-CM | POA: Diagnosis not present

## 2018-01-14 DIAGNOSIS — G4733 Obstructive sleep apnea (adult) (pediatric): Secondary | ICD-10-CM | POA: Diagnosis not present

## 2018-01-24 DIAGNOSIS — M542 Cervicalgia: Secondary | ICD-10-CM | POA: Diagnosis not present

## 2018-01-24 DIAGNOSIS — M546 Pain in thoracic spine: Secondary | ICD-10-CM | POA: Diagnosis not present

## 2018-01-28 ENCOUNTER — Ambulatory Visit: Payer: BLUE CROSS/BLUE SHIELD | Admitting: Family Medicine

## 2018-01-28 ENCOUNTER — Encounter: Payer: Self-pay | Admitting: Family Medicine

## 2018-01-28 VITALS — BP 120/62 | HR 56 | Temp 97.9°F | Ht 72.0 in | Wt 164.5 lb

## 2018-01-28 DIAGNOSIS — R002 Palpitations: Secondary | ICD-10-CM | POA: Diagnosis not present

## 2018-01-28 MED ORDER — PROPRANOLOL HCL 10 MG PO TABS: ORAL_TABLET | ORAL | 3 refills | Status: DC

## 2018-01-28 NOTE — Progress Notes (Signed)
   Subjective:    Patient ID: Darren Mejia, male    DOB: [birth year], 37 y.o.   MRN: [id number]  HPI Here to follow up on Propranolol. He takes this as needed to prevent palpitations during work presentations, public speaking, etc. He is very pleased with how this works.    Review of Systems  Constitutional: Negative.   Respiratory: Negative.   Cardiovascular: Negative.   Neurological: Negative.        Objective:   Physical Exam  Constitutional: He is oriented to person, place, and time. He appears well-developed and well-nourished.  Cardiovascular: Normal rate, regular rhythm, normal heart sounds and intact distal pulses.  Pulmonary/Chest: Effort normal and breath sounds normal. No respiratory distress. He has no wheezes. He has no rales.  Neurological: He is alert and oriented to person, place, and time.          Assessment & Plan:  Palpitations, well controlled. We refilled the meds.  Gershon CraneStephen Naod Sweetland, MD

## 2018-03-05 DIAGNOSIS — M546 Pain in thoracic spine: Secondary | ICD-10-CM | POA: Diagnosis not present

## 2018-03-05 DIAGNOSIS — M79602 Pain in left arm: Secondary | ICD-10-CM | POA: Diagnosis not present

## 2018-03-05 DIAGNOSIS — M542 Cervicalgia: Secondary | ICD-10-CM | POA: Diagnosis not present

## 2018-03-08 DIAGNOSIS — G4733 Obstructive sleep apnea (adult) (pediatric): Secondary | ICD-10-CM | POA: Diagnosis not present

## 2018-04-04 DIAGNOSIS — M25551 Pain in right hip: Secondary | ICD-10-CM | POA: Diagnosis not present

## 2018-04-04 DIAGNOSIS — M546 Pain in thoracic spine: Secondary | ICD-10-CM | POA: Diagnosis not present

## 2018-04-04 DIAGNOSIS — M542 Cervicalgia: Secondary | ICD-10-CM | POA: Diagnosis not present

## 2018-04-04 DIAGNOSIS — M545 Low back pain: Secondary | ICD-10-CM | POA: Diagnosis not present

## 2018-05-31 DIAGNOSIS — H04123 Dry eye syndrome of bilateral lacrimal glands: Secondary | ICD-10-CM | POA: Diagnosis not present

## 2018-06-26 DIAGNOSIS — G4733 Obstructive sleep apnea (adult) (pediatric): Secondary | ICD-10-CM | POA: Diagnosis not present

## 2018-07-10 DIAGNOSIS — M542 Cervicalgia: Secondary | ICD-10-CM | POA: Diagnosis not present

## 2018-07-10 DIAGNOSIS — M545 Low back pain: Secondary | ICD-10-CM | POA: Diagnosis not present

## 2018-07-10 DIAGNOSIS — M546 Pain in thoracic spine: Secondary | ICD-10-CM | POA: Diagnosis not present

## 2018-07-10 DIAGNOSIS — M25551 Pain in right hip: Secondary | ICD-10-CM | POA: Diagnosis not present

## 2018-07-30 DIAGNOSIS — M546 Pain in thoracic spine: Secondary | ICD-10-CM | POA: Diagnosis not present

## 2018-07-30 DIAGNOSIS — M79671 Pain in right foot: Secondary | ICD-10-CM | POA: Diagnosis not present

## 2018-07-30 DIAGNOSIS — M542 Cervicalgia: Secondary | ICD-10-CM | POA: Diagnosis not present

## 2018-07-31 DIAGNOSIS — L814 Other melanin hyperpigmentation: Secondary | ICD-10-CM | POA: Diagnosis not present

## 2018-07-31 DIAGNOSIS — L821 Other seborrheic keratosis: Secondary | ICD-10-CM | POA: Diagnosis not present

## 2018-07-31 DIAGNOSIS — D225 Melanocytic nevi of trunk: Secondary | ICD-10-CM | POA: Diagnosis not present

## 2018-07-31 DIAGNOSIS — D1801 Hemangioma of skin and subcutaneous tissue: Secondary | ICD-10-CM | POA: Diagnosis not present

## 2018-09-19 DIAGNOSIS — M546 Pain in thoracic spine: Secondary | ICD-10-CM | POA: Diagnosis not present

## 2018-09-19 DIAGNOSIS — M545 Low back pain: Secondary | ICD-10-CM | POA: Diagnosis not present

## 2018-09-19 DIAGNOSIS — M542 Cervicalgia: Secondary | ICD-10-CM | POA: Diagnosis not present

## 2018-09-19 DIAGNOSIS — M79605 Pain in left leg: Secondary | ICD-10-CM | POA: Diagnosis not present

## 2018-10-11 DIAGNOSIS — M546 Pain in thoracic spine: Secondary | ICD-10-CM | POA: Diagnosis not present

## 2018-10-11 DIAGNOSIS — M542 Cervicalgia: Secondary | ICD-10-CM | POA: Diagnosis not present

## 2018-10-14 ENCOUNTER — Other Ambulatory Visit: Payer: Self-pay | Admitting: Family Medicine

## 2018-10-23 ENCOUNTER — Other Ambulatory Visit: Payer: Self-pay | Admitting: Family Medicine

## 2018-10-23 MED ORDER — ESCITALOPRAM OXALATE 10 MG PO TABS: 10.0000 mg | ORAL_TABLET | Freq: Every day | ORAL | 0 refills | Status: DC

## 2018-10-23 NOTE — Telephone Encounter (Signed)
Copied from CRM [id number]. Topic: Quick Communication - Rx Refill/Question >> Oct 23, 2018  2:09 PM Stephannie LiSimmons, Adamarys Shall L, NT wrote: Medication:escitalopram (LEXAPRO) 10 MG tablet  Has the patient contacted their pharmacy? yes (Agent: If no, request that the patient contact the pharmacy for the refill. (Agent: If yes, when and what did the pharmacy advise?  Preferred Pharmacy (with phone number or street name [id number] [place], [place] - [phone] (Phone) [phone] (Fax)    Agent: Please be advised that RX refills may take up to 3 business days. We ask that you follow-up with your pharmacy.

## 2018-10-23 NOTE — Telephone Encounter (Signed)
30 day courtesy refill given  Requested Prescriptions  Pending Prescriptions Disp Refills  . escitalopram (LEXAPRO) 10 MG tablet 90 tablet 3    Sig: Take 1 tablet (10 mg total) by mouth daily.     Psychiatry:  Antidepressants - SSRI Failed - 10/23/2018  2:19 PM      Failed - Completed PHQ-2 or PHQ-9 in the last 360 days.      Failed - Valid encounter within last 6 months    Recent Outpatient Visits          8 months ago [facility]   [facility] Tera MaterStephen A, MD   2 years ago Preventative health care   [facility] Tera M[facility]Stephen A, [facility]   3 years ago Viral URI with cough   [facility] Tera M[facility]Stephen A, [facility]   4 years ago Heart palpitations   [facility] Tera MaterStephen A, MD   5 years ago [facility]ative health care   [facility] Tera MaterStephen A, [place]      Future Appointments            In 5 days Nelwyn SalisburyFry, Stephen A, MD [facility]

## 2018-10-28 ENCOUNTER — Ambulatory Visit: Payer: BLUE CROSS/BLUE SHIELD | Admitting: Family Medicine

## 2018-11-11 ENCOUNTER — Encounter: Payer: Self-pay | Admitting: Family Medicine

## 2018-11-11 ENCOUNTER — Ambulatory Visit (INDEPENDENT_AMBULATORY_CARE_PROVIDER_SITE_OTHER): Payer: BLUE CROSS/BLUE SHIELD | Admitting: Family Medicine

## 2018-11-11 VITALS — BP 120/72 | HR 51 | Temp 97.5°F | Ht 72.0 in | Wt 158.1 lb

## 2018-11-11 DIAGNOSIS — Z23 Encounter for immunization: Secondary | ICD-10-CM | POA: Diagnosis not present

## 2018-11-11 DIAGNOSIS — Z0000 Encounter for general adult medical examination without abnormal findings: Secondary | ICD-10-CM

## 2018-11-11 LAB — CBC WITH DIFFERENTIAL/PLATELET
BASOS ABS: 0 10*3/uL (ref 0.0–0.1)
BASOS PCT: 0.5 % (ref 0.0–3.0)
EOS ABS: 0 10*3/uL (ref 0.0–0.7)
Eosinophils Relative: 0.7 % (ref 0.0–5.0)
HCT: 45.3 % (ref 39.0–52.0)
Hemoglobin: 15.2 g/dL (ref 13.0–17.0)
LYMPHS ABS: 1.8 10*3/uL (ref 0.7–4.0)
Lymphocytes Relative: 37.5 % (ref 12.0–46.0)
MCHC: 33.7 g/dL (ref 30.0–36.0)
MCV: 96.9 fl (ref 78.0–100.0)
Monocytes Absolute: 0.3 10*3/uL (ref 0.1–1.0)
Monocytes Relative: 6.4 % (ref 3.0–12.0)
NEUTROS ABS: 2.7 10*3/uL (ref 1.4–7.7)
NEUTROS PCT: 54.9 % (ref 43.0–77.0)
Platelets: 201 10*3/uL (ref 150.0–400.0)
RBC: 4.67 Mil/uL (ref 4.22–5.81)
RDW: 13.3 % (ref 11.5–15.5)
WBC: 4.9 10*3/uL (ref 4.0–10.5)

## 2018-11-11 LAB — BASIC METABOLIC PANEL
BUN: 14 mg/dL (ref 6–23)
CHLORIDE: 102 meq/L (ref 96–112)
CO2: 31 meq/L (ref 19–32)
Calcium: 9.7 mg/dL (ref 8.4–10.5)
Creatinine, Ser: 0.89 mg/dL (ref 0.40–1.50)
GFR: 101.16 mL/min (ref >60.00)
Glucose, Bld: 80 mg/dL (ref 70–99)
POTASSIUM: 4.1 meq/L (ref 3.5–5.1)
Sodium: 140 mEq/L (ref 135–145)

## 2018-11-11 LAB — POC URINALSYSI DIPSTICK (AUTOMATED)
Bilirubin, UA: NEGATIVE
Blood, UA: NEGATIVE
Glucose, UA: NEGATIVE
KETONES UA: NEGATIVE
Leukocytes, UA: NEGATIVE
Nitrite, UA: NEGATIVE
PH UA: 6.5 (ref 5.0–8.0)
PROTEIN UA: NEGATIVE
SPEC GRAV UA: 1.015 (ref 1.010–1.025)
Urobilinogen, UA: 0.2 E.U./dL

## 2018-11-11 LAB — LIPID PANEL
CHOLESTEROL: 211 mg/dL — AB (ref 0–200)
HDL: 70.3 mg/dL (ref >39.00)
LDL CALC: 125 mg/dL — AB (ref 0–99)
NonHDL: 140.41
Total CHOL/HDL Ratio: 3
Triglycerides: 76 mg/dL (ref 0.0–149.0)
VLDL: 15.2 mg/dL (ref 0.0–40.0)

## 2018-11-11 LAB — HEPATIC FUNCTION PANEL
ALBUMIN: 4.8 g/dL (ref 3.5–5.2)
ALT: 33 U/L (ref 0–53)
AST: 36 U/L (ref 0–37)
Alkaline Phosphatase: 60 U/L (ref 39–117)
Bilirubin, Direct: 0.1 mg/dL (ref 0.0–0.3)
TOTAL PROTEIN: 7.6 g/dL (ref 6.0–8.3)
Total Bilirubin: 0.8 mg/dL (ref 0.2–1.2)

## 2018-11-11 LAB — TSH: TSH: 0.98 u[IU]/mL (ref 0.35–4.50)

## 2018-11-11 MED ORDER — PROPRANOLOL HCL 10 MG PO TABS: ORAL_TABLET | ORAL | 3 refills | Status: DC

## 2018-11-11 MED ORDER — ESCITALOPRAM OXALATE 10 MG PO TABS: 10.0000 mg | ORAL_TABLET | Freq: Every day | ORAL | 3 refills | Status: DC

## 2018-11-11 NOTE — Progress Notes (Signed)
   Subjective:    Patient ID: Darren BaileyMark Borjon, male    DOB: [birth year], 37 y.o.   MRN: [id number]  HPI Here for a well exam. He feels fine.    Review of Systems  Constitutional: Negative.   HENT: Negative.   Eyes: Negative.   Respiratory: Negative.   Cardiovascular: Negative.   Gastrointestinal: Negative.   Genitourinary: Negative.   Musculoskeletal: Negative.   Skin: Negative.   Neurological: Negative.   Psychiatric/Behavioral: Negative.        Objective:   Physical Exam  Constitutional: He is oriented to person, place, and time. He appears well-developed and well-nourished. No distress.  HENT:  Head: Normocephalic and atraumatic.  Right Ear: External ear normal.  Nose: Nose normal.  Mouth/Throat: Oropharynx is clear and moist. No oropharyngeal exudate.  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 Conjunctivae and EOM are normal. Right eye exhibits no discharge. Left eye exhibits no discharge. No scleral icterus.  Neck: Neck supple. No JVD present. No tracheal deviation present. No thyromegaly present.  Cardiovascular: Normal rate, regular rhythm, normal heart sounds and intact distal pulses. Exam reveals no gallop and no friction rub.  No murmur heard. Pulmonary/Chest: Effort normal and breath sounds normal. No respiratory distress. He has no wheezes. He has no rales. He exhibits no tenderness.  Abdominal: Soft. Bowel sounds are normal. He exhibits no distension and no mass. There is no tenderness. There is no rebound and no guarding.  Genitourinary: Rectum normal, prostate normal and penis normal. Rectal exam shows guaiac negative stool. No penile tenderness.  Musculoskeletal: Normal range of motion. He exhibits no edema or tenderness.  Lymphadenopathy:    He has no cervical adenopathy.  Neurological: He is alert and oriented to person, place, and time. He has normal reflexes. He displays normal reflexes. No cranial nerve  deficit. He exhibits normal muscle tone. Coordination normal.  Skin: Skin is warm and dry. No rash noted. He is not diaphoretic. No erythema. No pallor.  Psychiatric: He has a normal mood and affect. His behavior is normal. Judgment and thought content normal.          Assessment & Plan:  Well exam . We discussed diet and exercise. Get fasting labs.  Darren CraneStephen Naima Veldhuizen, MD

## 2018-11-18 DIAGNOSIS — G4733 Obstructive sleep apnea (adult) (pediatric): Secondary | ICD-10-CM | POA: Diagnosis not present

## 2018-11-21 DIAGNOSIS — M25551 Pain in right hip: Secondary | ICD-10-CM | POA: Diagnosis not present

## 2018-11-21 DIAGNOSIS — M542 Cervicalgia: Secondary | ICD-10-CM | POA: Diagnosis not present

## 2018-11-21 DIAGNOSIS — M546 Pain in thoracic spine: Secondary | ICD-10-CM | POA: Diagnosis not present

## 2018-12-24 DIAGNOSIS — M545 Low back pain: Secondary | ICD-10-CM | POA: Diagnosis not present

## 2018-12-24 DIAGNOSIS — M546 Pain in thoracic spine: Secondary | ICD-10-CM | POA: Diagnosis not present

## 2018-12-24 DIAGNOSIS — M542 Cervicalgia: Secondary | ICD-10-CM | POA: Diagnosis not present

## 2018-12-24 DIAGNOSIS — M25551 Pain in right hip: Secondary | ICD-10-CM | POA: Diagnosis not present

## 2018-12-25 DIAGNOSIS — G4733 Obstructive sleep apnea (adult) (pediatric): Secondary | ICD-10-CM | POA: Diagnosis not present

## 2019-01-13 DIAGNOSIS — Z9989 Dependence on other enabling machines and devices: Secondary | ICD-10-CM | POA: Diagnosis not present

## 2019-01-13 DIAGNOSIS — F458 Other somatoform disorders: Secondary | ICD-10-CM | POA: Diagnosis not present

## 2019-01-13 DIAGNOSIS — G4733 Obstructive sleep apnea (adult) (pediatric): Secondary | ICD-10-CM | POA: Diagnosis not present

## 2019-01-30 DIAGNOSIS — M546 Pain in thoracic spine: Secondary | ICD-10-CM | POA: Diagnosis not present

## 2019-01-30 DIAGNOSIS — M542 Cervicalgia: Secondary | ICD-10-CM | POA: Diagnosis not present

## 2019-02-27 DIAGNOSIS — M546 Pain in thoracic spine: Secondary | ICD-10-CM | POA: Diagnosis not present

## 2019-02-27 DIAGNOSIS — M542 Cervicalgia: Secondary | ICD-10-CM | POA: Diagnosis not present

## 2019-04-23 DIAGNOSIS — G4733 Obstructive sleep apnea (adult) (pediatric): Secondary | ICD-10-CM | POA: Diagnosis not present

## 2019-05-09 DIAGNOSIS — M542 Cervicalgia: Secondary | ICD-10-CM | POA: Diagnosis not present

## 2019-05-09 DIAGNOSIS — M25551 Pain in right hip: Secondary | ICD-10-CM | POA: Diagnosis not present

## 2019-05-09 DIAGNOSIS — M546 Pain in thoracic spine: Secondary | ICD-10-CM | POA: Diagnosis not present

## 2019-05-09 DIAGNOSIS — M545 Low back pain: Secondary | ICD-10-CM | POA: Diagnosis not present

## 2019-06-04 DIAGNOSIS — M545 Low back pain: Secondary | ICD-10-CM | POA: Diagnosis not present

## 2019-06-04 DIAGNOSIS — M546 Pain in thoracic spine: Secondary | ICD-10-CM | POA: Diagnosis not present

## 2019-06-04 DIAGNOSIS — M542 Cervicalgia: Secondary | ICD-10-CM | POA: Diagnosis not present

## 2019-06-04 DIAGNOSIS — M25551 Pain in right hip: Secondary | ICD-10-CM | POA: Diagnosis not present

## 2019-07-15 DIAGNOSIS — M25551 Pain in right hip: Secondary | ICD-10-CM | POA: Diagnosis not present

## 2019-07-15 DIAGNOSIS — M542 Cervicalgia: Secondary | ICD-10-CM | POA: Diagnosis not present

## 2019-07-15 DIAGNOSIS — M546 Pain in thoracic spine: Secondary | ICD-10-CM | POA: Diagnosis not present

## 2019-07-15 DIAGNOSIS — M545 Low back pain: Secondary | ICD-10-CM | POA: Diagnosis not present

## 2019-07-22 DIAGNOSIS — G4733 Obstructive sleep apnea (adult) (pediatric): Secondary | ICD-10-CM | POA: Diagnosis not present

## 2019-07-29 DIAGNOSIS — H7292 Unspecified perforation of tympanic membrane, left ear: Secondary | ICD-10-CM | POA: Diagnosis not present

## 2019-07-29 DIAGNOSIS — H66015 Acute suppurative otitis media with spontaneous rupture of ear drum, recurrent, left ear: Secondary | ICD-10-CM | POA: Diagnosis not present

## 2019-07-29 DIAGNOSIS — H6692 Otitis media, unspecified, left ear: Secondary | ICD-10-CM | POA: Diagnosis not present

## 2019-09-08 DIAGNOSIS — H7292 Unspecified perforation of tympanic membrane, left ear: Secondary | ICD-10-CM | POA: Diagnosis not present

## 2019-09-08 DIAGNOSIS — H90A12 Conductive hearing loss, unilateral, left ear with restricted hearing on the contralateral side: Secondary | ICD-10-CM | POA: Diagnosis not present

## 2019-09-10 DIAGNOSIS — M25551 Pain in right hip: Secondary | ICD-10-CM | POA: Diagnosis not present

## 2019-09-10 DIAGNOSIS — M545 Low back pain: Secondary | ICD-10-CM | POA: Diagnosis not present

## 2019-09-10 DIAGNOSIS — M79604 Pain in right leg: Secondary | ICD-10-CM | POA: Diagnosis not present

## 2019-10-14 DIAGNOSIS — M25551 Pain in right hip: Secondary | ICD-10-CM | POA: Diagnosis not present

## 2019-10-14 DIAGNOSIS — M79604 Pain in right leg: Secondary | ICD-10-CM | POA: Diagnosis not present

## 2019-10-14 DIAGNOSIS — M545 Low back pain: Secondary | ICD-10-CM | POA: Diagnosis not present

## 2019-10-16 DIAGNOSIS — H7292 Unspecified perforation of tympanic membrane, left ear: Secondary | ICD-10-CM | POA: Diagnosis not present

## 2019-10-16 DIAGNOSIS — H66015 Acute suppurative otitis media with spontaneous rupture of ear drum, recurrent, left ear: Secondary | ICD-10-CM | POA: Diagnosis not present

## 2019-10-20 DIAGNOSIS — G4733 Obstructive sleep apnea (adult) (pediatric): Secondary | ICD-10-CM | POA: Diagnosis not present

## 2019-11-29 ENCOUNTER — Other Ambulatory Visit: Payer: Self-pay | Admitting: Family Medicine

## 2019-12-02 ENCOUNTER — Ambulatory Visit (INDEPENDENT_AMBULATORY_CARE_PROVIDER_SITE_OTHER): Payer: BC Managed Care – PPO | Admitting: Family Medicine

## 2019-12-02 ENCOUNTER — Other Ambulatory Visit: Payer: Self-pay

## 2019-12-02 ENCOUNTER — Encounter: Payer: Self-pay | Admitting: Family Medicine

## 2019-12-02 DIAGNOSIS — F418 Other specified anxiety disorders: Secondary | ICD-10-CM | POA: Diagnosis not present

## 2019-12-02 MED ORDER — ESCITALOPRAM OXALATE 10 MG PO TABS: 10.0000 mg | ORAL_TABLET | Freq: Every day | ORAL | 3 refills | Status: DC

## 2019-12-02 NOTE — Progress Notes (Signed)
Subjective:    Patient ID: Darren Mejia, male    DOB: 20-Dec-1980, 38 y.o.    Here to follow up on depression with anxiety. He is doing well and he is pleased with how Lexapro helps him. His sleep and appetite are intact. No side effects to report.   Virtual Visit via Video Note  I connected with the patient on 12/02/19 at  2:30 PM EST by a video enabled telemedicine application and verified that I am speaking with the correct person using two identifiers.  Location patient: home Location provider:work or home office Persons participating in the virtual visit: patient, provider  I discussed the limitations of evaluation and management by telemedicine and the availability of in person appointments. The patient expressed understanding and agreed to proceed.   HPI:    ROS: See pertinent positives and negatives per HPI.  Past Medical History:  Diagnosis Date  . Acne rosacea   . Depression with anxiety   . Male pattern baldness   . OSA (obstructive sleep apnea)    wears CPAP   . Perforated ear drum    left TM     Past Surgical History:  Procedure Laterality Date  . HIP ARTHROSCOPY Right 2014   per [facility]. Merleen [facility]   . SEPTOPLASTY  2007  . TYMPANOPLASTY  2006   left side     Family History  Problem Relation Age of Onset  . Stroke Father   . Hypertension Father   . Atrial fibrillation Father   . Colon polyps Father   . Hyperlipidemia Father   . Emphysema Paternal Grandfather   . Prostate cancer Maternal Uncle   . Heart disease Maternal Grandfather      Current Outpatient Medications:  .  CIPRODEX otic suspension, , Disp: , Rfl:  .  Dapsone (ACZONE) 5 % topical gel, Apply 1 application topically 2 (two) times daily., Disp: , Rfl:  .  escitalopram (LEXAPRO) 10 MG tablet, Take 1 tablet (10 mg total) by mouth daily., Disp: 90 tablet, Rfl: 3 .  fish oil-omega-3 fatty acids 1000 MG capsule, Take 3 capsules by mouth every other day. , Disp: , Rfl:  .   folic acid (FOLVITE) 465 MCG tablet, Take 800 mcg by mouth daily. , Disp: , Rfl:  .  Minoxidil (ROGAINE MENS) 5 % FOAM, Apply topically 2 (two) times daily., Disp: , Rfl:  .  Multiple Vitamin (MULTIVITAMIN) tablet, Take 1 tablet by mouth daily.  , Disp: , Rfl:  .  propranolol (INDERAL) 10 MG tablet, TAKE 1 TABLET (10 MG TOTAL) BY MOUTH DAILY AS NEEDED (PUBLIC SPEAKING)., Disp: 90 tablet, Rfl: 3 .  Psyllium (METAMUCIL FIBER PO), Take by mouth., Disp: , Rfl:  .  tazarotene (TAZORAC) 0.1 % gel, Apply topically at bedtime.  , Disp: , Rfl:   EXAM:  VITALS per patient if applicable:  GENERAL: alert, oriented, appears well and in no acute distress  HEENT: atraumatic, conjunttiva clear, no obvious abnormalities on inspection of external nose and ears  NECK: normal movements of the head and neck  LUNGS: on inspection no signs of respiratory distress, breathing rate appears normal, no obvious gross SOB, gasping or wheezing  CV: no obvious cyanosis  MS: moves all visible extremities without noticeable abnormality  PSYCH/NEURO: pleasant and cooperative, no obvious depression or anxiety, speech and thought processing grossly intact  ASSESSMENT AND PLAN: He is doing well with depression and anxiety. meds were refilled.  Alysia Penna, MD  Discussed  the following assessment and plan:  Depression with anxiety     I discussed the assessment and treatment plan with the patient. The patient was provided an opportunity to ask questions and all were answered. The patient agreed with the plan and demonstrated an understanding of the instructions.   The patient was advised to call back or seek an in-person evaluation if the symptoms worsen or if the condition fails to improve as anticipated.   Review of Systems     Objective:   Physical Exam        Assessment & Plan:

## 2019-12-22 DIAGNOSIS — M25551 Pain in right hip: Secondary | ICD-10-CM | POA: Diagnosis not present

## 2019-12-22 DIAGNOSIS — M542 Cervicalgia: Secondary | ICD-10-CM | POA: Diagnosis not present

## 2019-12-22 DIAGNOSIS — M79604 Pain in right leg: Secondary | ICD-10-CM | POA: Diagnosis not present

## 2019-12-22 DIAGNOSIS — M545 Low back pain: Secondary | ICD-10-CM | POA: Diagnosis not present

## 2020-01-12 DIAGNOSIS — L814 Other melanin hyperpigmentation: Secondary | ICD-10-CM | POA: Diagnosis not present

## 2020-01-12 DIAGNOSIS — D225 Melanocytic nevi of trunk: Secondary | ICD-10-CM | POA: Diagnosis not present

## 2020-01-12 DIAGNOSIS — L700 Acne vulgaris: Secondary | ICD-10-CM | POA: Diagnosis not present

## 2020-01-12 DIAGNOSIS — L821 Other seborrheic keratosis: Secondary | ICD-10-CM | POA: Diagnosis not present

## 2020-01-19 DIAGNOSIS — G4733 Obstructive sleep apnea (adult) (pediatric): Secondary | ICD-10-CM | POA: Diagnosis not present

## 2020-02-05 DIAGNOSIS — H9012 Conductive hearing loss, unilateral, left ear, with unrestricted hearing on the contralateral side: Secondary | ICD-10-CM | POA: Diagnosis not present

## 2020-02-05 DIAGNOSIS — H7292 Unspecified perforation of tympanic membrane, left ear: Secondary | ICD-10-CM | POA: Diagnosis not present

## 2020-02-05 DIAGNOSIS — Z9889 Other specified postprocedural states: Secondary | ICD-10-CM | POA: Diagnosis not present

## 2020-02-17 DIAGNOSIS — M545 Low back pain: Secondary | ICD-10-CM | POA: Diagnosis not present

## 2020-02-17 DIAGNOSIS — M25551 Pain in right hip: Secondary | ICD-10-CM | POA: Diagnosis not present

## 2020-02-17 DIAGNOSIS — M79604 Pain in right leg: Secondary | ICD-10-CM | POA: Diagnosis not present

## 2020-03-16 DIAGNOSIS — M25551 Pain in right hip: Secondary | ICD-10-CM | POA: Diagnosis not present

## 2020-03-16 DIAGNOSIS — M545 Low back pain: Secondary | ICD-10-CM | POA: Diagnosis not present

## 2020-03-16 DIAGNOSIS — M79604 Pain in right leg: Secondary | ICD-10-CM | POA: Diagnosis not present

## 2020-04-12 ENCOUNTER — Other Ambulatory Visit: Payer: Self-pay | Admitting: Family Medicine

## 2020-06-16 DIAGNOSIS — M79602 Pain in left arm: Secondary | ICD-10-CM | POA: Diagnosis not present

## 2020-06-16 DIAGNOSIS — M542 Cervicalgia: Secondary | ICD-10-CM | POA: Diagnosis not present

## 2020-06-16 DIAGNOSIS — M546 Pain in thoracic spine: Secondary | ICD-10-CM | POA: Diagnosis not present

## 2020-07-14 DIAGNOSIS — M25551 Pain in right hip: Secondary | ICD-10-CM | POA: Diagnosis not present

## 2020-07-14 DIAGNOSIS — M546 Pain in thoracic spine: Secondary | ICD-10-CM | POA: Diagnosis not present

## 2020-07-14 DIAGNOSIS — M545 Low back pain: Secondary | ICD-10-CM | POA: Diagnosis not present

## 2020-08-11 DIAGNOSIS — M545 Low back pain: Secondary | ICD-10-CM | POA: Diagnosis not present

## 2020-08-11 DIAGNOSIS — M25551 Pain in right hip: Secondary | ICD-10-CM | POA: Diagnosis not present

## 2020-08-11 DIAGNOSIS — M542 Cervicalgia: Secondary | ICD-10-CM | POA: Diagnosis not present

## 2020-08-11 DIAGNOSIS — M546 Pain in thoracic spine: Secondary | ICD-10-CM | POA: Diagnosis not present

## 2020-08-13 DIAGNOSIS — G4733 Obstructive sleep apnea (adult) (pediatric): Secondary | ICD-10-CM | POA: Diagnosis not present

## 2020-09-14 DIAGNOSIS — M546 Pain in thoracic spine: Secondary | ICD-10-CM | POA: Diagnosis not present

## 2020-09-14 DIAGNOSIS — M25551 Pain in right hip: Secondary | ICD-10-CM | POA: Diagnosis not present

## 2020-09-14 DIAGNOSIS — M545 Low back pain, unspecified: Secondary | ICD-10-CM | POA: Diagnosis not present

## 2020-11-13 ENCOUNTER — Other Ambulatory Visit: Payer: Self-pay | Admitting: Family Medicine

## 2020-11-15 ENCOUNTER — Ambulatory Visit (INDEPENDENT_AMBULATORY_CARE_PROVIDER_SITE_OTHER): Payer: BC Managed Care – PPO | Admitting: Family Medicine

## 2020-11-15 ENCOUNTER — Other Ambulatory Visit: Payer: Self-pay

## 2020-11-15 ENCOUNTER — Encounter: Payer: Self-pay | Admitting: Family Medicine

## 2020-11-15 VITALS — BP 119/80 | HR 62 | Temp 97.5°F | Resp 18 | Ht 72.0 in | Wt 166.0 lb

## 2020-11-15 DIAGNOSIS — Z0000 Encounter for general adult medical examination without abnormal findings: Secondary | ICD-10-CM

## 2020-11-15 MED ORDER — FOLIC ACID 400 MCG PO TABS: 800.0000 ug | ORAL_TABLET | Freq: Every day | ORAL | 0 refills | Status: AC

## 2020-11-15 MED ORDER — PROPRANOLOL HCL 10 MG PO TABS: ORAL_TABLET | ORAL | 3 refills | Status: DC

## 2020-11-15 MED ORDER — DAPSONE 7.5 % EX GEL: CUTANEOUS | 0 refills | Status: AC

## 2020-11-15 MED ORDER — ESCITALOPRAM OXALATE 10 MG PO TABS: 10.0000 mg | ORAL_TABLET | Freq: Every day | ORAL | 3 refills | Status: DC

## 2020-11-15 MED ORDER — OMEGA-3 FATTY ACIDS 1000 MG PO CAPS: 3.0000 | ORAL_CAPSULE | ORAL | 0 refills | Status: DC

## 2020-11-15 NOTE — Progress Notes (Signed)
   Subjective:    Patient ID: Darren Mejia, male    DOB: Mar 02, 1981, 39 y.o.   MRN: [id number]  HPI Here for a well exam. He is doing well.    Review of Systems  Constitutional: Negative.   HENT: Negative.   Eyes: Negative.   Respiratory: Negative.   Cardiovascular: Negative.   Gastrointestinal: Negative.   Genitourinary: Negative.   Musculoskeletal: Negative.   Skin: Negative.   Neurological: Negative.   Psychiatric/Behavioral: Negative.        Objective:   Physical Exam Constitutional:      General: He is not in acute distress.    Appearance: He is well-developed. He is not diaphoretic.  HENT:     Head: Normocephalic and atraumatic.     Right Ear: External ear normal.     Left Ear: External ear normal.     Nose: Nose normal.     Mouth/Throat:     Pharynx: No oropharyngeal exudate.  Eyes:     General: No scleral icterus.       Right eye: No discharge.        Left eye: No discharge.     Conjunctiva/sclera: Conjunctivae normal.     Pupils: Pupils are equal, round, and reactive to light.  Neck:     Thyroid: No thyromegaly.     Vascular: No JVD.     Trachea: No tracheal deviation.  Cardiovascular:     Rate and Rhythm: Normal rate and regular rhythm.     Heart sounds: Normal heart sounds. No murmur heard.  No friction rub. No gallop.   Pulmonary:     Effort: Pulmonary effort is normal. No respiratory distress.     Breath sounds: Normal breath sounds. No wheezing or rales.  Chest:     Chest wall: No tenderness.  Abdominal:     General: Bowel sounds are normal. There is no distension.     Palpations: Abdomen is soft. There is no mass.     Tenderness: There is no abdominal tenderness. There is no guarding or rebound.  Genitourinary:    Penis: Normal. No tenderness.      Testes: Normal.  Musculoskeletal:        General: No tenderness. Normal range of motion.     Cervical back: Neck supple.  Lymphadenopathy:     Cervical: No cervical adenopathy.  Skin:     General: Skin is warm and dry.     Coloration: Skin is not pale.     Findings: No erythema or rash.  Neurological:     Mental Status: He is alert and oriented to person, place, and time.     Cranial Nerves: No cranial nerve deficit.     Motor: No abnormal muscle tone.     Coordination: Coordination normal.     Deep Tendon Reflexes: Reflexes are normal and symmetric. Reflexes normal.  Psychiatric:        Behavior: Behavior normal.        Thought Content: Thought content normal.        Judgment: Judgment normal.           Assessment & Plan:  Well exam. We discussed diet and exercise. Get fasting labs.  Gershon Crane, MD

## 2020-11-16 LAB — HEPATIC FUNCTION PANEL
AG Ratio: 1.6 (calc) (ref 1.0–2.5)
ALT: 27 U/L (ref 9–46)
AST: 32 U/L (ref 10–40)
Albumin: 4.6 g/dL (ref 3.6–5.1)
Alkaline phosphatase (APISO): 57 U/L (ref 36–130)
Bilirubin, Direct: 0.1 mg/dL (ref 0.0–0.2)
Globulin: 2.8 g/dL (calc) (ref 1.9–3.7)
Indirect Bilirubin: 0.9 mg/dL (calc) (ref 0.2–1.2)
Total Bilirubin: 1 mg/dL (ref 0.2–1.2)
Total Protein: 7.4 g/dL (ref 6.1–8.1)

## 2020-11-16 LAB — BASIC METABOLIC PANEL WITH GFR
BUN: 18 mg/dL (ref 7–25)
CO2: 32 mmol/L (ref 20–32)
Calcium: 9.6 mg/dL (ref 8.6–10.3)
Chloride: 102 mmol/L (ref 98–110)
Creat: 0.87 mg/dL (ref 0.60–1.35)
GFR, Est African American: 125 mL/min/{1.73_m2} (ref >=60)
GFR, Est Non African American: 108 mL/min/{1.73_m2} (ref >=60)
Glucose, Bld: 85 mg/dL (ref 65–99)
Potassium: 4.6 mmol/L (ref 3.5–5.3)
Sodium: 139 mmol/L (ref 135–146)

## 2020-11-16 LAB — CBC WITH DIFFERENTIAL/PLATELET
Absolute Monocytes: 312 cells/uL (ref 200–950)
Basophils Absolute: 21 cells/uL (ref 0–200)
Basophils Relative: 0.5 %
Eosinophils Absolute: 82 cells/uL (ref 15–500)
Eosinophils Relative: 2 %
HCT: 45.4 % (ref 38.5–50.0)
Hemoglobin: 15.1 g/dL (ref 13.2–17.1)
Lymphs Abs: 1513 cells/uL (ref 850–3900)
MCH: 31.5 pg (ref 27.0–33.0)
MCHC: 33.3 g/dL (ref 32.0–36.0)
MCV: 94.8 fL (ref 80.0–100.0)
MPV: 11.9 fL (ref 7.5–12.5)
Monocytes Relative: 7.6 %
Neutro Abs: 2173 cells/uL (ref 1500–7800)
Neutrophils Relative %: 53 %
Platelets: 192 10*3/uL (ref 140–400)
RBC: 4.79 10*6/uL (ref 4.20–5.80)
RDW: 12.7 % (ref 11.0–15.0)
Total Lymphocyte: 36.9 %
WBC: 4.1 10*3/uL (ref 3.8–10.8)

## 2020-11-16 LAB — LIPID PANEL
Cholesterol: 199 mg/dL (ref <200)
HDL: 64 mg/dL (ref >=40)
LDL Cholesterol (Calc): 115 mg/dL (calc) — ABNORMAL HIGH
Non-HDL Cholesterol (Calc): 135 mg/dL (calc) — ABNORMAL HIGH (ref <130)
Total CHOL/HDL Ratio: 3.1 (calc) (ref <5.0)
Triglycerides: 96 mg/dL (ref <150)

## 2020-11-16 LAB — TSH: TSH: 1.89 mIU/L (ref 0.40–4.50)

## 2020-11-22 DIAGNOSIS — Z20822 Contact with and (suspected) exposure to covid-19: Secondary | ICD-10-CM | POA: Diagnosis not present

## 2020-11-23 DIAGNOSIS — Z03818 Encounter for observation for suspected exposure to other biological agents ruled out: Secondary | ICD-10-CM | POA: Diagnosis not present

## 2021-11-24 ENCOUNTER — Other Ambulatory Visit: Payer: Self-pay | Admitting: Family Medicine

## 2021-11-28 ENCOUNTER — Other Ambulatory Visit: Payer: Self-pay

## 2021-11-28 MED ORDER — ESCITALOPRAM OXALATE 10 MG PO TABS
10.0000 mg | ORAL_TABLET | Freq: Every day | ORAL | 0 refills | Status: DC
Start: 2021-11-28 — End: 2021-12-15

## 2021-11-28 NOTE — Telephone Encounter (Signed)
Call in #30 and have him make an OV

## 2021-11-28 NOTE — Telephone Encounter (Signed)
Pt has appointment scheduled for 12/15/2021, refill for 30 tablets sent per Dr Clent Ridges

## 2021-12-15 ENCOUNTER — Ambulatory Visit (INDEPENDENT_AMBULATORY_CARE_PROVIDER_SITE_OTHER): Payer: 59 | Admitting: Family Medicine

## 2021-12-15 ENCOUNTER — Encounter: Payer: Self-pay | Admitting: Family Medicine

## 2021-12-15 VITALS — BP 110/80 | HR 52 | Temp 97.9°F | Ht 72.0 in | Wt 164.0 lb

## 2021-12-15 DIAGNOSIS — Z0000 Encounter for general adult medical examination without abnormal findings: Secondary | ICD-10-CM | POA: Diagnosis not present

## 2021-12-15 LAB — CBC WITH DIFFERENTIAL/PLATELET
Basophils Absolute: 0 10*3/uL (ref 0.0–0.1)
Basophils Relative: 0.6 % (ref 0.0–3.0)
Eosinophils Absolute: 0.1 10*3/uL (ref 0.0–0.7)
Eosinophils Relative: 1.6 % (ref 0.0–5.0)
HCT: 45 % (ref 39.0–52.0)
Hemoglobin: 14.9 g/dL (ref 13.0–17.0)
Lymphocytes Relative: 40.4 % (ref 12.0–46.0)
Lymphs Abs: 1.7 10*3/uL (ref 0.7–4.0)
MCHC: 33 g/dL (ref 30.0–36.0)
MCV: 94.9 fl (ref 78.0–100.0)
Monocytes Absolute: 0.3 10*3/uL (ref 0.1–1.0)
Monocytes Relative: 7.1 % (ref 3.0–12.0)
Neutro Abs: 2.2 10*3/uL (ref 1.4–7.7)
Neutrophils Relative %: 50.3 % (ref 43.0–77.0)
Platelets: 213 10*3/uL (ref 150.0–400.0)
RBC: 4.74 Mil/uL (ref 4.22–5.81)
RDW: 13.7 % (ref 11.5–15.5)
WBC: 4.3 10*3/uL (ref 4.0–10.5)

## 2021-12-15 LAB — LIPID PANEL
Cholesterol: 214 mg/dL — ABNORMAL HIGH (ref 0–200)
HDL: 67.9 mg/dL (ref >39.00)
LDL Cholesterol: 124 mg/dL — ABNORMAL HIGH (ref 0–99)
NonHDL: 145.71
Total CHOL/HDL Ratio: 3
Triglycerides: 107 mg/dL (ref 0.0–149.0)
VLDL: 21.4 mg/dL (ref 0.0–40.0)

## 2021-12-15 LAB — BASIC METABOLIC PANEL
BUN: 15 mg/dL (ref 6–23)
CO2: 31 mEq/L (ref 19–32)
Calcium: 10.1 mg/dL (ref 8.4–10.5)
Chloride: 102 mEq/L (ref 96–112)
Creatinine, Ser: 0.94 mg/dL (ref 0.40–1.50)
GFR: 100.3 mL/min (ref >60.00)
Glucose, Bld: 87 mg/dL (ref 70–99)
Potassium: 4.6 mEq/L (ref 3.5–5.1)
Sodium: 138 mEq/L (ref 135–145)

## 2021-12-15 LAB — HEPATIC FUNCTION PANEL
ALT: 17 U/L (ref 0–53)
AST: 25 U/L (ref 0–37)
Albumin: 4.7 g/dL (ref 3.5–5.2)
Alkaline Phosphatase: 51 U/L (ref 39–117)
Bilirubin, Direct: 0.2 mg/dL (ref 0.0–0.3)
Total Bilirubin: 0.9 mg/dL (ref 0.2–1.2)
Total Protein: 7.9 g/dL (ref 6.0–8.3)

## 2021-12-15 LAB — TSH: TSH: 2.02 u[IU]/mL (ref 0.35–5.50)

## 2021-12-15 LAB — HEMOGLOBIN A1C: Hgb A1c MFr Bld: 5.6 % (ref 4.6–6.5)

## 2021-12-15 MED ORDER — ESCITALOPRAM OXALATE 10 MG PO TABS: 10.0000 mg | ORAL_TABLET | Freq: Every day | ORAL | 3 refills | Status: DC

## 2021-12-15 MED ORDER — PROPRANOLOL HCL 10 MG PO TABS: ORAL_TABLET | ORAL | 3 refills | Status: DC

## 2021-12-15 NOTE — Progress Notes (Signed)
Subjective:    Patient ID: Darren Mejia, male    DOB: Nov 19, 1981, 41 y.o.   MRN: [id number]  HPI Here for a well exam. He has been doing well but he does mention several weeks of a mild sharp pain in both groin areas that comes and goes. Overall this has been improving. No recent trauma, but he is a runner and he does a lot of plank exercises. No bowel and urinary symptoms.    Review of Systems  Constitutional: Negative.   HENT: Negative.    Eyes: Negative.   Respiratory: Negative.    Cardiovascular: Negative.   Gastrointestinal:  Positive for abdominal pain.  Genitourinary: Negative.   Musculoskeletal: Negative.   Skin: Negative.   Neurological: Negative.   Psychiatric/Behavioral: Negative.        Objective:   Physical Exam Constitutional:      General: He is not in acute distress.    Appearance: Normal appearance. He is well-developed. He is not diaphoretic.  HENT:     Head: Normocephalic and atraumatic.     Right Ear: External ear normal.     Left Ear: External ear normal.     Nose: Nose normal.     Mouth/Throat:     Pharynx: No oropharyngeal exudate.  Eyes:     General: No scleral icterus.       Right eye: No discharge.        Left eye: No discharge.     Conjunctiva/sclera: Conjunctivae normal.     Pupils: Pupils are equal, round, and reactive to light.  Neck:     Thyroid: No thyromegaly.     Vascular: No JVD.     Trachea: No tracheal deviation.  Cardiovascular:     Rate and Rhythm: Normal rate and regular rhythm.     Heart sounds: Normal heart sounds. No murmur heard.   No friction rub. No gallop.  Pulmonary:     Effort: Pulmonary effort is normal. No respiratory distress.     Breath sounds: Normal breath sounds. No wheezing or rales.  Chest:     Chest wall: No tenderness.  Abdominal:     General: Bowel sounds are normal. There is no distension.     Palpations: Abdomen is soft. There is no mass.     Tenderness: There is no abdominal tenderness. There is  no guarding or rebound.     Comments: Slightly tender along both inguinal ligaments, no hernias or masses   Genitourinary:    Penis: Normal. No tenderness.      Testes: Normal.     Prostate: Normal.     Rectum: Normal. Guaiac result negative.  Musculoskeletal:        General: No tenderness. Normal range of motion.     Cervical back: Neck supple.  Lymphadenopathy:     Cervical: No cervical adenopathy.  Skin:    General: Skin is warm and dry.     Coloration: Skin is not pale.     Findings: No erythema or rash.  Neurological:     Mental Status: He is alert and oriented to person, place, and time.     Cranial Nerves: No cranial nerve deficit.     Motor: No abnormal muscle tone.     Coordination: Coordination normal.     Deep Tendon Reflexes: Reflexes are normal and symmetric. Reflexes normal.  Psychiatric:        Behavior: Behavior normal.        Thought Content: Thought content normal.  Judgment: Judgment normal.          Assessment & Plan:  Well exam. We discussed diet and exercise. Get fasting labs. He seems to have a very mild abdominal strain which is resolving. Recheck as needed.  Gershon Crane, MD

## 2021-12-19 ENCOUNTER — Telehealth: Payer: Self-pay | Admitting: Family Medicine

## 2021-12-19 NOTE — Telephone Encounter (Signed)
We already started him on Lipitor (see my last Result Note)

## 2021-12-19 NOTE — Telephone Encounter (Signed)
Patient called in to ask if he could get the medication that was discussed during his visit on 01/05 for elevated cholesterol.  Patient could be contacted at [phone].  Please advise.

## 2021-12-19 NOTE — Telephone Encounter (Signed)
Please advise 

## 2021-12-20 ENCOUNTER — Other Ambulatory Visit: Payer: Self-pay

## 2021-12-20 DIAGNOSIS — E785 Hyperlipidemia, unspecified: Secondary | ICD-10-CM

## 2021-12-20 MED ORDER — ATORVASTATIN CALCIUM 10 MG PO TABS: 10.0000 mg | ORAL_TABLET | Freq: Every day | ORAL | 3 refills | Status: DC

## 2021-12-20 NOTE — Telephone Encounter (Addendum)
Spoke with patient about lab results.   Lab orders placed.  Lab appointment scheduled 03/26/22.  [facility] [place]

## 2021-12-22 ENCOUNTER — Encounter: Payer: Self-pay | Admitting: Family Medicine

## 2021-12-23 ENCOUNTER — Ambulatory Visit (INDEPENDENT_AMBULATORY_CARE_PROVIDER_SITE_OTHER): Payer: 59 | Admitting: Family Medicine

## 2021-12-23 ENCOUNTER — Telehealth: Payer: Self-pay | Admitting: Family Medicine

## 2021-12-23 VITALS — BP 120/82 | HR 53 | Temp 98.5°F | Wt 162.0 lb

## 2021-12-23 DIAGNOSIS — L538 Other specified erythematous conditions: Secondary | ICD-10-CM

## 2021-12-23 DIAGNOSIS — J020 Streptococcal pharyngitis: Secondary | ICD-10-CM

## 2021-12-23 DIAGNOSIS — J029 Acute pharyngitis, unspecified: Secondary | ICD-10-CM | POA: Diagnosis not present

## 2021-12-23 LAB — POCT RAPID STREP A (OFFICE): Rapid Strep A Screen: POSITIVE — AB

## 2021-12-23 MED ORDER — CLOTRIMAZOLE-BETAMETHASONE 1-0.05 % EX CREA: 1.0000 "application " | TOPICAL_CREAM | Freq: Two times a day (BID) | CUTANEOUS | 0 refills | Status: DC

## 2021-12-23 MED ORDER — CEPHALEXIN 500 MG PO CAPS: 500.0000 mg | ORAL_CAPSULE | Freq: Three times a day (TID) | ORAL | 0 refills | Status: AC

## 2021-12-23 NOTE — Telephone Encounter (Signed)
Yes he should make an OV so we can test for strep. As for the Preparation H, it would be perfectly safe for him to use it as needed

## 2021-12-23 NOTE — Progress Notes (Signed)
° °  Subjective:    Patient ID: Darren Mejia, male    DOB: 1981/10/11, 41 y.o.   MRN: [id number]  HPI Here for 3 days of a bad ST and irritation around the anus. No fever. No coughing or NVD or body aches. He is taking Tylenol and applying Preparation H to the anal area.    Review of Systems  Constitutional: Negative.   HENT:  Positive for sore throat. Negative for congestion, postnasal drip and sinus pressure.   Eyes: Negative.   Respiratory: Negative.    Gastrointestinal:  Positive for rectal pain. Negative for abdominal pain, blood in stool, constipation, diarrhea, nausea and vomiting.      Objective:   Physical Exam Constitutional:      Appearance: Normal appearance.  HENT:     Right Ear: Tympanic membrane, ear canal and external ear normal.     Left Ear: Tympanic membrane, ear canal and external ear normal.     Nose: Nose normal.     Mouth/Throat:     Comments: Posterior OP is bright red with no exudate  Eyes:     Conjunctiva/sclera: Conjunctivae normal.  Neck:     Comments: Tender enlarged AC nodes  Cardiovascular:     Rate and Rhythm: Normal rate and regular rhythm.     Pulses: Normal pulses.     Heart sounds: Normal heart sounds.  Pulmonary:     Effort: Pulmonary effort is normal.     Breath sounds: Normal breath sounds.  Genitourinary:    Comments: The perianal area is red and irritated  Neurological:     Mental Status: He is alert.          Assessment & Plan:  He has a strep throat and perianal irritation. We will treat with 10 days of Keflex. Also he will stop the prep H and apply Lotrisone ointment to the anal area BID.  Darren Crane, MD

## 2021-12-23 NOTE — Telephone Encounter (Signed)
Patient calling in with respiratory symptoms: Shortness of breath, chest pain, palpitations or other red words send to Triage  Does the patient have a fever over 100, cough, congestion, sore throat, runny nose, lost of taste/smell (please list symptoms that patient has)? Sore throat, pain swallowing and neck swollen  What date did symptoms start? 01/08 (If over 5 days ago, pt may be scheduled for in person visit)  Have you tested for Covid in the last 5 days? No   If yes, was it positive []  OR negative [] ? If positive in the last 5 days, please schedule virtual visit now. If negative, schedule for an in person OV with the next available provider if PCP has no openings. Please also let patient know they will be tested again (follow the script below)  "you will have to arrive prior to your appt time to be Covid tested. Please park in back of office at the cone & call [phone] to let the staff know you have arrived. A staff member will meet you at your car to do a rapid covid test. Once the test has resulted you will be notified by phone of your results to determine if appt will remain an in person visit or be converted to a virtual/phone visit. If you arrive less than before your appt time, your visit will be automatically converted to virtual & any recommended testing will happen AFTER the visit."   THINGS TO REMEMBER  If no availability for virtual visit in office,  please schedule another [place] office  If no availability at another [place] office, please instruct patient that they can schedule an evisit or virtual visit through their mychart account. Visits up to 8pm  patients can be seen in office 5 days after positive COVID test

## 2022-03-14 ENCOUNTER — Other Ambulatory Visit: Payer: Self-pay

## 2022-03-14 ENCOUNTER — Encounter (HOSPITAL_BASED_OUTPATIENT_CLINIC_OR_DEPARTMENT_OTHER): Payer: Self-pay | Admitting: Obstetrics and Gynecology

## 2022-03-14 ENCOUNTER — Emergency Department (HOSPITAL_BASED_OUTPATIENT_CLINIC_OR_DEPARTMENT_OTHER)
Admission: EM | Admit: 2022-03-14 | Discharge: 2022-03-14 | Disposition: A | Discharge status: Home / Self Care | Payer: 59 | Attending: Emergency Medicine | Admitting: Emergency Medicine

## 2022-03-14 ENCOUNTER — Emergency Department (HOSPITAL_BASED_OUTPATIENT_CLINIC_OR_DEPARTMENT_OTHER): Payer: 59

## 2022-03-14 DIAGNOSIS — R112 Nausea with vomiting, unspecified: Secondary | ICD-10-CM | POA: Diagnosis not present

## 2022-03-14 DIAGNOSIS — R1031 Right lower quadrant pain: Secondary | ICD-10-CM | POA: Diagnosis present

## 2022-03-14 DIAGNOSIS — R1084 Generalized abdominal pain: Secondary | ICD-10-CM

## 2022-03-14 LAB — COMPREHENSIVE METABOLIC PANEL
ALT: 23 U/L (ref 0–44)
AST: 28 U/L (ref 15–41)
Albumin: 4.7 g/dL (ref 3.5–5.0)
Alkaline Phosphatase: 52 U/L (ref 38–126)
Anion gap: 10 (ref 5–15)
BUN: 17 mg/dL (ref 6–20)
CO2: 26 mmol/L (ref 22–32)
Calcium: 9.9 mg/dL (ref 8.9–10.3)
Chloride: 103 mmol/L (ref 98–111)
Creatinine, Ser: 0.85 mg/dL (ref 0.61–1.24)
GFR, Estimated: >60 mL/min (ref >60)
Glucose, Bld: 105 mg/dL — ABNORMAL HIGH (ref 70–99)
Potassium: 4 mmol/L (ref 3.5–5.1)
Sodium: 139 mmol/L (ref 135–145)
Total Bilirubin: 0.6 mg/dL (ref 0.3–1.2)
Total Protein: 7.9 g/dL (ref 6.5–8.1)

## 2022-03-14 LAB — CBC WITH DIFFERENTIAL/PLATELET
Abs Immature Granulocytes: 0 10*3/uL (ref 0.00–0.07)
Basophils Absolute: 0 10*3/uL (ref 0.0–0.1)
Basophils Relative: 0 %
Eosinophils Absolute: 0.1 10*3/uL (ref 0.0–0.5)
Eosinophils Relative: 2 %
HCT: 44.2 % (ref 39.0–52.0)
Hemoglobin: 14.6 g/dL (ref 13.0–17.0)
Immature Granulocytes: 0 %
Lymphocytes Relative: 44 %
Lymphs Abs: 2.1 10*3/uL (ref 0.7–4.0)
MCH: 31 pg (ref 26.0–34.0)
MCHC: 33 g/dL (ref 30.0–36.0)
MCV: 93.8 fL (ref 80.0–100.0)
Monocytes Absolute: 0.4 10*3/uL (ref 0.1–1.0)
Monocytes Relative: 8 %
Neutro Abs: 2.2 10*3/uL (ref 1.7–7.7)
Neutrophils Relative %: 46 %
Platelets: 175 10*3/uL (ref 150–400)
RBC: 4.71 MIL/uL (ref 4.22–5.81)
RDW: 13 % (ref 11.5–15.5)
WBC: 4.7 10*3/uL (ref 4.0–10.5)
nRBC: 0 % (ref 0.0–0.2)

## 2022-03-14 LAB — URINALYSIS, ROUTINE W REFLEX MICROSCOPIC
Bilirubin Urine: NEGATIVE
Glucose, UA: NEGATIVE mg/dL
Hgb urine dipstick: NEGATIVE
Ketones, ur: NEGATIVE mg/dL
Leukocytes,Ua: NEGATIVE
Nitrite: NEGATIVE
Protein, ur: NEGATIVE mg/dL
Specific Gravity, Urine: 1.012 (ref 1.005–1.030)
pH: 8 (ref 5.0–8.0)

## 2022-03-14 LAB — LIPASE, BLOOD: Lipase: 38 U/L (ref 11–51)

## 2022-03-14 MED ORDER — KETOROLAC TROMETHAMINE 30 MG/ML IJ SOLN
30.0000 mg | Freq: Once | INTRAMUSCULAR | Status: AC
  Administered 2022-03-14: 30 mg via INTRAVENOUS
  Filled 2022-03-14: qty 1

## 2022-03-14 MED ORDER — SODIUM CHLORIDE 0.9 % IV BOLUS: 1000.0000 mL | Freq: Once | INTRAVENOUS | Status: DC

## 2022-03-14 MED ORDER — ONDANSETRON HCL 4 MG/2ML IJ SOLN
4.0000 mg | Freq: Once | INTRAMUSCULAR | Status: AC
  Administered 2022-03-14: 4 mg via INTRAVENOUS
  Filled 2022-03-14: qty 2

## 2022-03-14 MED ORDER — SODIUM CHLORIDE 0.9 % IV BOLUS
1000.0000 mL | Freq: Once | INTRAVENOUS | Status: AC
  Administered 2022-03-14: 1000 mL via INTRAVENOUS

## 2022-03-14 NOTE — ED Provider Notes (Signed)
?[facility] EMER[facility]ENCY DEPT ?Provider Note ? ? ?CSN: [id number] ?Arrival date & time: 03/14/22  2248 ? ?  ? ?History ? ?Chief Complaint  ?Patient presents with  ? Abdominal Pain  ? ? ?Darren Mejia is a 41 y.o. male. ? ?Pt is a 41 yo male with a pmhx significant for anxiety, osa, and high cholesterol.  Pt said he had severe RLQ pain radiating into his groin this morning.  + n/v.  No hx kidney stones.  No f/c.  ? ? ?  ? ?Home Medications ?Prior to Admission medications   ?Medication Sig Start Date End Date Taking? Authorizing Provider  ?atorvastatin (LIPITOR) 10 MG tablet Take 1 tablet (10 mg total) by mouth daily. 12/20/21   Nelwyn Salisbury, MD  ?clotrimazole-betamethasone (LOTRISONE) cream Apply 1 application topically 2 (two) times daily. 12/23/21   Nelwyn Salisbury, MD  ?Dapsone 7.5 % GEL Apply twice daily 11/15/20   Nelwyn Salisbury, MD  ?escitalopram (LEXAPRO) 10 MG tablet Take 1 tablet (10 mg total) by mouth daily. 12/15/21   Nelwyn Salisbury, MD  ?fish oil-omega-3 fatty acids 1000 MG capsule Take 3 capsules (3 g total) by mouth every other day. 11/15/20   Nelwyn Salisbury, MD  ?folic acid (FOLVITE) 400 MCG tablet Take 2 tablets (800 mcg total) by mouth daily. 11/15/20   Nelwyn Salisbury, MD  ?METAMUCIL FIBER PO Take by mouth daily at 12 noon. Taking 5 cap daily    [provider]  ?propranolol (INDERAL) 10 MG tablet TAKE 1 TABLET (10 MG TOTAL) BY MOUTH DAILY AS NEEDED (PUBLIC SPEAKING). 12/15/21   Nelwyn Salisbury, MD  ?   ? ?Allergies    ?Patient has no known allergies.   ? ?Review of Systems   ?Review of Systems  ?Gastrointestinal:  Positive for abdominal pain, nausea and vomiting.  ?All other systems reviewed and are negative. ? ?Physical Exam ?Updated Vital Signs ?BP 121/72   Pulse (!) 52   Temp (!) 97.4 ?F (36.3 ?C) (Oral)   Resp 13   Ht 6' (1.829 m)   Wt 73.5 kg   SpO2 100%   BMI 21.97 kg/m?  ?Physical Exam ?Vitals and nursing note reviewed.  ?Constitutional:   ?   Appearance: He is well-developed.   ?HENT:  ?   Head: Normocephalic and atraumatic.  ?   Mouth/Throat:  ?   Mouth: Mucous membranes are dry.  ?Eyes:  ?   Extraocular Movements: Extraocular movements intact.  ?   Pupils: Pupils are equal, round, and reactive to light.  ?Cardiovascular:  ?   Rate and Rhythm: Normal rate and regular rhythm.  ?Pulmonary:  ?   Effort: Pulmonary effort is normal.  ?   Breath sounds: Normal breath sounds.  ?Abdominal:  ?   General: Abdomen is flat. Bowel sounds are normal.  ?   Palpations: Abdomen is soft.  ?   Tenderness: There is abdominal tenderness in the right lower quadrant.  ?Skin: ?   General: Skin is warm.  ?   Capillary Refill: Capillary refill takes less than 2 seconds.  ?Neurological:  ?   General: No focal deficit present.  ?   Mental Status: He is alert and oriented to person, place, and time.  ?Psychiatric:     ?   Mood and Affect: Mood normal.     ?   Behavior: Behavior normal.  ? ? ?ED Results / Procedures / Treatments   ?Labs ?(all labs ordered are listed, but  only abnormal results are displayed) ?Labs Reviewed  ?COMPREHENSIVE METABOLIC PANEL - Abnormal; Notable for the following components:  ?    Result Value  ? Glucose, Bld 105 (*)   ? All other components within normal limits  ?URINALYSIS, ROUTINE W REFLEX MICROSCOPIC - Abnormal; Notable for the following components:  ? APPearance HAZY (*)   ? All other components within normal limits  ?CBC WITH DIFFERENTIAL/PLATELET  ?LIPASE, BLOOD  ? ? ?EKG ?None ? ?Radiology ?CT Renal Stone Study ? ?Result Date: 03/14/2022 ?CLINICAL DATA:  Acute onset of right flank and right lower quadrant pain. Nausea and vomiting. EXAM: CT ABDOMEN AND PELVIS WITHOUT CONTRAST TECHNIQUE: Multidetector CT imaging of the abdomen and pelvis was performed following the standard protocol without IV contrast. RADIATION DOSE REDUCTION: This exam was performed according to the departmental dose-optimization program which includes automated exposure control, adjustment of the mA and/or kV  according to patient size and/or use of iterative reconstruction technique. COMPARISON:  None. FINDINGS: Lower chest: No acute findings. Hepatobiliary: No mass visualized on this unenhanced exam. Gallbladder is unremarkable. No evidence of biliary ductal dilatation. Pancreas: No mass or inflammatory process visualized on this unenhanced exam. Spleen:  Within normal limits in size. Adrenals/Urinary tract: No evidence of urolithiasis or hydronephrosis. Unremarkable unopacified urinary bladder. Stomach/Bowel: No evidence of obstruction, inflammatory process, or abnormal fluid collections. Although the appendix is not directly visualized, no inflammatory process seen in region of the cecum or elsewhere. Large amount of stool noted throughout the colon. Vascular/Lymphatic: No pathologically enlarged lymph nodes identified. No evidence of abdominal aortic aneurysm. Reproductive:  No mass or other significant abnormality. Other:  None. Musculoskeletal:  No suspicious bone lesions identified. IMPRESSION: No evidence of urolithiasis, hydronephrosis, or other acute findings. Large stool burden noted; recommend clinical correlation for possible constipation. Electronically Signed   By: Danae OrleansJohn A Stahl M.D.   On: 03/14/2022 08:12   ? ?Procedures ?Procedures  ? ? ?Medications Ordered in ED ?Medications  ?sodium chloride 0.9 % bolus 1,000 mL (1,000 mLs Intravenous Not Given 03/14/22 1045)  ?sodium chloride 0.9 % bolus 1,000 mL (0 mLs Intravenous Stopped 03/14/22 0941)  ?ondansetron [facility](ZOFRAN) injection 4 mg (4 mg Intravenous Given 03/14/22 0755)  ?ketorolac (TORADOL) 30 MG/ML injection 30 mg (30 mg Intravenous Given 03/14/22 0756)  ? ? ?ED Course/ Medical Decision Making/ A&P ?  ?                        ?Medical Decision Making ?Amount and/or Complexity of Data Reviewed ?Labs: ordered. ?Radiology: ordered. ? ?Risk ?Prescription drug management. ? ? ?This patient presents to the ED for concern of abd pain, this involves an extensive number of  treatment options, and is a complaint that carries with it a high risk of complications and morbidity.  The differential diagnosis includes kidney stone, appendicitis, diverticulitis, cholecystitis, uti ? ? ?Co morbidities that complicate the patient evaluation ? ?Anxiety, high cholesterol ? ? ?Additional history obtained: ? ?Additional history obtained from epic chart review ?External records from outside source obtained and reviewed including wife ? ? ?Lab Tests: ? ?I Ordered, and personally interpreted labs.  The pertinent results include:  cbc, cmp, lipase nl.  UA nl ? ? ?Imaging Studies ordered: ? ?I ordered imaging studies including CT renal  ?I independently visualized and interpreted imaging which showed  ?  ?IMPRESSION:  ?No evidence of urolithiasis, hydronephrosis, or other acute  ?findings.  ?   ?Large stool burden noted; recommend clinical correlation  for  ?possible constipation.  ? ?I agree with the radiologist interpretation ? ? ?Cardiac Monitoring: ? ?The patient was maintained on a cardiac monitor.  I personally viewed and interpreted the cardiac monitored which showed an underlying rhythm of: nsr ? ? ?Medicines ordered and prescription drug management: ? ?I ordered medication including toradol  for pain  ?Reevaluation of the patient after these medicines showed that the patient improved ?I have reviewed the patients home medicines and have made adjustments as needed ? ? ?Test Considered: ? ?Ct abd/pelvis ? ? ? ?Problem List / ED Course: ? ?Abd pain:  likely due to constipation.  Pt encouraged to use miralax as needed. ? ? ?Reevaluation: ? ?After the interventions noted above, I reevaluated the patient and found that they have :improved ? ? ?Social Determinants of Health: ? ?Lives at home with his wife ? ? ?Dispostion: ? ?After consideration of the diagnostic results and the patients response to treatment, I feel that the patent would benefit from discharge with outpatient f/u.   ? ? ? ? ? ? ? ?Final  Clinical Impression(s) / ED Diagnoses ?Final diagnoses:  ?Generalized abdominal pain  ? ? ?Rx / DC Orders ?ED Discharge Orders   ? ? None  ? ?  ? ? ?  ?Jacalyn Lefevre, MD ?03/14/22 1057 ? ?

## 2022-03-14 NOTE — ED Triage Notes (Signed)
Patient presents to the ER for acute onset abdominal pain with RLQ pain that radiates across the pelvis and into the groin with ambulation. Patient endorses nausea and emesis, denies diarrhea, chest pain or ShOB.  ?

## 2022-03-14 NOTE — ED Notes (Signed)
RT Notes: Pt. placed back on EKG monitor, Pulse Ox., U/A was labelled,dated and given to Lab for RN. ?

## 2022-03-27 ENCOUNTER — Ambulatory Visit: Payer: 59 | Admitting: Family Medicine

## 2022-03-27 VITALS — BP 106/82 | Ht 72.0 in | Wt 160.0 lb

## 2022-03-27 DIAGNOSIS — S3981XA Other specified injuries of abdomen, initial encounter: Secondary | ICD-10-CM

## 2022-03-27 NOTE — Patient Instructions (Signed)
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 ?Ice the area 15 minutes at a time 3-4 times a day. ?Aleve OR ibuprofen as needed. ?Pain should be at a level of 1-2/10 when you're exercising and it's ok to do so as long as it doesn't exceed this. ?Do home exercises as directed. ?Consider formal physical therapy if not improving with the above - just call me and we can send in a referral. ?Follow up with me as needed otherwise. ?

## 2022-03-27 NOTE — Progress Notes (Signed)
PCP: Nelwyn Salisbury, MD ? ?Subjective:  ? ?HPI: ?Patient is a 41 y.o. male here for lower abdominal pain. ? ?Patient reports having a cold in January. Coughed a lot and his lower abdomen was pretty sore.  ? ?Then about 3 weeks ago was on a run and felt significant pain is his lower abdomen at the midline and right side. He ran through the pain but woke up the next morning in significant pain in the same area. Rested from running for a few days and swam instead. Had some improvement in pain level. Started running again and feels nagging soreness to the present day. He is able to run through it but doesn't want to injure it further. Runs recreationally about 30 miles each week. His lower abdomen tends to bother him the most when engaging his core to lie down or turn over or do exercises like planks. ? ?Patient was also seen in the ED on 03/14/22 with RLQ pain radiating into his groin. Patient states that this was a sharp pain different than the pain described in previous incidents. CT scan with no evidence of kidney stones but did have large stool burden which they attributed his symptoms to. Pain went away after medication and did not return. ? ?Patient has history of right hip arthroscopy in 2014 for reported impingement symptoms.  ? ?Past Medical History:  ?Diagnosis Date  ? Acne rosacea   ? Depression with anxiety   ? Male pattern baldness   ? OSA (obstructive sleep apnea)   ? wears CPAP   ? 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 ? ?Current Outpatient Medications on File Prior to Visit  ?Medication Sig Dispense Refill  ? atorvastatin (LIPITOR) 10 MG tablet Take 1 tablet (10 mg total) by mouth daily. 90 tablet 3  ? clotrimazole-betamethasone (LOTRISONE) cream Apply 1 application topically 2 (two) times daily. 45 g 0  ? Dapsone 7.5 % GEL Apply twice daily 60 g 0  ? escitalopram (LEXAPRO) 10 MG tablet Take 1 tablet (10 mg total) by mouth daily. 90 tablet 3  ? fish oil-omega-3 fatty acids 1000 MG capsule Take 3 capsules (3  g total) by mouth every other day. 3 capsule 0  ? folic acid (FOLVITE) 400 MCG tablet Take 2 tablets (800 mcg total) by mouth daily. 1 tablet 0  ? METAMUCIL FIBER PO Take by mouth daily at 12 noon. Taking 5 cap daily    ? propranolol (INDERAL) 10 MG tablet TAKE 1 TABLET (10 MG TOTAL) BY MOUTH DAILY AS NEEDED (PUBLIC SPEAKING). 90 tablet 3  ? ?No current facility-administered medications on file prior to visit.  ? ? ?Past Surgical History:  ?Procedure Laterality Date  ? HIP ARTHROSCOPY Right 2014  ? per Dr. Renella [facility]   ? SEPTOPLASTY  2007  ? TYMPANOPLASTY  2006  ? left side   ? ? ?No Known Allergies ? ?There were no vitals taken for this visit. ? ?   ? View : No data to display.  ?  ?  ?  ? ? ?   ? View : No data to display.  ?  ?  ?  ? ? ?    ?Objective:  ?Physical Exam: ? ?Gen: NAD, comfortable in exam room ?CV: Regular rate, well perfused ?Resp: No increased work of breathing, coughing or wheezing ?Psych: Normal mood and affect.  ?MSK: No erythema or bruising to the lower abdomen. TTP over the RLQ and  midline lower abdomen. No TTP over bilateral hips and groin. Full ROM in bilateral hips without pain. Strength 5/5 in hip flexion, extension, abduction and adduction without pain. Negative bilateral logroll, bilateral hop test, bilateral Pearlean Brownie. Pain with abdominal crunch. ?  ?Assessment & Plan:  ?1. 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 No evidence of damage to bone or visible muscular defect on recent CT. Also considered osteitis pubis and pelvic stress fracture - however, his exam made both of these less likely. We discussed the relative timeline for recovery from a sports hernia. Patient to perform home exercises in addition to relative rest. He does have upcoming ear surgery that will cause him to rest from running for at least 4 weeks. Can try ice and heat in addition to ibuprofen as needed for pain  relief. Will consider formal PT if not recovering with home exercises. Follow up as needed. ? ?Festus Aloe ?MS4, [employer] ? ? ?

## 2022-03-28 ENCOUNTER — Encounter: Payer: Self-pay | Admitting: Family Medicine

## 2022-04-07 ENCOUNTER — Encounter: Payer: Self-pay | Admitting: Family Medicine

## 2022-04-07 NOTE — Telephone Encounter (Signed)
Since it was not embedded there is nothing to worry about. The area around it often turns red but since it is fading away, it should not be a problem  ?

## 2022-04-17 HISTORY — PX: TYMPANOPLASTY: SHX33

## 2022-04-25 ENCOUNTER — Other Ambulatory Visit (INDEPENDENT_AMBULATORY_CARE_PROVIDER_SITE_OTHER): Payer: 59

## 2022-04-25 DIAGNOSIS — E785 Hyperlipidemia, unspecified: Secondary | ICD-10-CM

## 2022-04-25 LAB — HEPATIC FUNCTION PANEL
ALT: 20 U/L (ref 0–53)
AST: 22 U/L (ref 0–37)
Albumin: 4.4 g/dL (ref 3.5–5.2)
Alkaline Phosphatase: 45 U/L (ref 39–117)
Bilirubin, Direct: 0.1 mg/dL (ref 0.0–0.3)
Total Bilirubin: 0.6 mg/dL (ref 0.2–1.2)
Total Protein: 7.3 g/dL (ref 6.0–8.3)

## 2022-04-25 LAB — LIPID PANEL
Cholesterol: 155 mg/dL (ref 0–200)
HDL: 63.3 mg/dL (ref >39.00)
LDL Cholesterol: 75 mg/dL (ref 0–99)
NonHDL: 92.05
Total CHOL/HDL Ratio: 2
Triglycerides: 87 mg/dL (ref 0.0–149.0)
VLDL: 17.4 mg/dL (ref 0.0–40.0)

## 2022-08-30 ENCOUNTER — Ambulatory Visit: Payer: 59 | Admitting: Family Medicine

## 2022-08-30 ENCOUNTER — Encounter: Payer: Self-pay | Admitting: Family Medicine

## 2022-08-30 VITALS — BP 118/78 | HR 56 | Temp 98.1°F | Wt 163.4 lb

## 2022-08-30 DIAGNOSIS — L03011 Cellulitis of right finger: Secondary | ICD-10-CM

## 2022-08-30 MED ORDER — CEPHALEXIN 500 MG PO CAPS: 500.0000 mg | ORAL_CAPSULE | Freq: Three times a day (TID) | ORAL | 0 refills | Status: AC

## 2022-08-30 NOTE — Progress Notes (Signed)
   Subjective:    Patient ID: Darren Mejia, male    DOB: 03-Jun-1981, 41 y.o.   MRN: [id number]  HPI Here for 4 days of pain and swelling in the right 4th finger. No recent trauma.    Review of Systems  Constitutional: Negative.   Respiratory: Negative.    Cardiovascular: Negative.   Skin:  Positive for wound.       Objective:   Physical Exam Constitutional:      General: He is not in acute distress.    Appearance: Normal appearance.  Cardiovascular:     Rate and Rhythm: Normal rate and regular rhythm.     Pulses: Normal pulses.     Heart sounds: Normal heart sounds.  Pulmonary:     Effort: Pulmonary effort is normal.     Breath sounds: Normal breath sounds.  Skin:    Comments: The skin along the medial edge of the right 4th fingernail is swollen and tender   Neurological:     Mental Status: He is alert.           Assessment & Plan:  Paronychia, treat with 10 days of Keflex.  Alysia Penna, MD

## 2022-11-20 ENCOUNTER — Other Ambulatory Visit: Payer: Self-pay | Admitting: Family Medicine

## 2022-11-20 DIAGNOSIS — E785 Hyperlipidemia, unspecified: Secondary | ICD-10-CM

## 2022-12-14 ENCOUNTER — Other Ambulatory Visit: Payer: Self-pay | Admitting: Family Medicine

## 2023-02-08 ENCOUNTER — Encounter: Payer: Self-pay | Admitting: Family Medicine

## 2023-02-08 ENCOUNTER — Ambulatory Visit (INDEPENDENT_AMBULATORY_CARE_PROVIDER_SITE_OTHER): Payer: 59 | Admitting: Family Medicine

## 2023-02-08 VITALS — BP 116/72 | HR 54 | Temp 97.8°F | Ht 72.0 in | Wt 167.0 lb

## 2023-02-08 DIAGNOSIS — Z0000 Encounter for general adult medical examination without abnormal findings: Secondary | ICD-10-CM

## 2023-02-08 DIAGNOSIS — H9072 Mixed conductive and sensorineural hearing loss, unilateral, left ear, with unrestricted hearing on the contralateral side: Secondary | ICD-10-CM | POA: Diagnosis not present

## 2023-02-08 LAB — CBC WITH DIFFERENTIAL/PLATELET
Basophils Absolute: 0 10*3/uL (ref 0.0–0.1)
Basophils Relative: 0.5 % (ref 0.0–3.0)
Eosinophils Absolute: 0.1 10*3/uL (ref 0.0–0.7)
Eosinophils Relative: 2 % (ref 0.0–5.0)
HCT: 45.7 % (ref 39.0–52.0)
Hemoglobin: 15.4 g/dL (ref 13.0–17.0)
Lymphocytes Relative: 48.2 % — ABNORMAL HIGH (ref 12.0–46.0)
Lymphs Abs: 1.9 10*3/uL (ref 0.7–4.0)
MCHC: 33.7 g/dL (ref 30.0–36.0)
MCV: 95.3 fl (ref 78.0–100.0)
Monocytes Absolute: 0.3 10*3/uL (ref 0.1–1.0)
Monocytes Relative: 6.9 % (ref 3.0–12.0)
Neutro Abs: 1.7 10*3/uL (ref 1.4–7.7)
Neutrophils Relative %: 42.4 % — ABNORMAL LOW (ref 43.0–77.0)
Platelets: 218 10*3/uL (ref 150.0–400.0)
RBC: 4.79 Mil/uL (ref 4.22–5.81)
RDW: 13.1 % (ref 11.5–15.5)
WBC: 3.9 10*3/uL — ABNORMAL LOW (ref 4.0–10.5)

## 2023-02-08 LAB — BASIC METABOLIC PANEL
BUN: 15 mg/dL (ref 6–23)
CO2: 29 mEq/L (ref 19–32)
Calcium: 10 mg/dL (ref 8.4–10.5)
Chloride: 102 mEq/L (ref 96–112)
Creatinine, Ser: 0.92 mg/dL (ref 0.40–1.50)
GFR: 102.09 mL/min (ref >60.00)
Glucose, Bld: 85 mg/dL (ref 70–99)
Potassium: 4.5 mEq/L (ref 3.5–5.1)
Sodium: 140 mEq/L (ref 135–145)

## 2023-02-08 LAB — HEPATIC FUNCTION PANEL
ALT: 24 U/L (ref 0–53)
AST: 31 U/L (ref 0–37)
Albumin: 4.5 g/dL (ref 3.5–5.2)
Alkaline Phosphatase: 47 U/L (ref 39–117)
Bilirubin, Direct: 0.2 mg/dL (ref 0.0–0.3)
Total Bilirubin: 0.9 mg/dL (ref 0.2–1.2)
Total Protein: 7.5 g/dL (ref 6.0–8.3)

## 2023-02-08 LAB — LIPID PANEL
Cholesterol: 163 mg/dL (ref 0–200)
HDL: 74.2 mg/dL (ref >39.00)
LDL Cholesterol: 74 mg/dL (ref 0–99)
NonHDL: 88.54
Total CHOL/HDL Ratio: 2
Triglycerides: 74 mg/dL (ref 0.0–149.0)
VLDL: 14.8 mg/dL (ref 0.0–40.0)

## 2023-02-08 LAB — TSH: TSH: 1.77 u[IU]/mL (ref 0.35–5.50)

## 2023-02-08 LAB — HEMOGLOBIN A1C: Hgb A1c MFr Bld: 5.5 % (ref 4.6–6.5)

## 2023-02-08 MED ORDER — FLUTICASONE PROPIONATE 50 MCG/ACT NA SUSP: 2.0000 | Freq: Every day | NASAL | 11 refills | Status: DC

## 2023-02-08 NOTE — Progress Notes (Signed)
Subjective:    Patient ID: Darren Mejia, male    DOB: [birth year], 42 y.o.   MRN: [id number]  HPI Here for a well exam. He feels well in general. He does mention frequent nasal congestion and he has to blow his nose a lot most days. No itchy eyes or sneezing.    Review of Systems  Constitutional: Negative.   HENT:  Positive for congestion.   Eyes: Negative.   Respiratory: Negative.    Cardiovascular: Negative.   Gastrointestinal: Negative.   Genitourinary: Negative.   Musculoskeletal: Negative.   Skin: Negative.   Neurological: Negative.   Psychiatric/Behavioral: Negative.         Objective:   Physical Exam Constitutional:      General: He is not in acute distress.    Appearance: Normal appearance. He is well-developed. He is not diaphoretic.  HENT:     Head: Normocephalic and atraumatic.     Right Ear: External ear normal.     Left Ear: External ear normal.     Nose: Nose normal.     Mouth/Throat:     Pharynx: No oropharyngeal exudate.  Eyes:     General: No scleral icterus.       Right eye: No discharge.        Left eye: No discharge.     Conjunctiva/sclera: Conjunctivae normal.     Pupils: Pupils are equal, round, and reactive to light.  Neck:     Thyroid: No thyromegaly.     Vascular: No JVD.     Trachea: No tracheal deviation.  Cardiovascular:     Rate and Rhythm: Normal rate and regular rhythm.     Heart sounds: Normal heart sounds. No murmur heard.    No friction rub. No gallop.  Pulmonary:     Effort: Pulmonary effort is normal. No respiratory distress.     Breath sounds: Normal breath sounds. No wheezing or rales.  Chest:     Chest wall: No tenderness.  Abdominal:     General: Bowel sounds are normal. There is no distension.     Palpations: Abdomen is soft. There is no mass.     Tenderness: There is no abdominal tenderness. There is no guarding or rebound.  Genitourinary:    Penis: Normal. No tenderness.      Testes: Normal.  Musculoskeletal:         General: No tenderness. Normal range of motion.     Cervical back: Neck supple.  Lymphadenopathy:     Cervical: No cervical adenopathy.  Skin:    General: Skin is warm and dry.     Coloration: Skin is not pale.     Findings: No erythema or rash.  Neurological:     Mental Status: He is alert and oriented to person, place, and time.     Cranial Nerves: No cranial nerve deficit.     Motor: No abnormal muscle tone.     Coordination: Coordination normal.     Deep Tendon Reflexes: Reflexes are normal and symmetric. Reflexes normal.  Psychiatric:        Behavior: Behavior normal.        Thought Content: Thought content normal.        Judgment: Judgment normal.           Assessment & Plan:  Well exam. We discussed diet and exercise. Get fasting labs. He has some allergic rhinitis, so I suggested he use Flonase nasal sprays daily.  Alysia Penna, MD

## 2023-02-11 ENCOUNTER — Encounter: Payer: Self-pay | Admitting: Family Medicine

## 2023-02-12 NOTE — Telephone Encounter (Signed)
Only one value was outside of the "normal " range and this was only 1/10 of a point out. No need to repeat labs until the next physical

## 2023-02-21 ENCOUNTER — Other Ambulatory Visit: Payer: Self-pay | Admitting: Family Medicine

## 2023-02-21 DIAGNOSIS — E785 Hyperlipidemia, unspecified: Secondary | ICD-10-CM

## 2023-03-11 ENCOUNTER — Other Ambulatory Visit: Payer: Self-pay | Admitting: Family Medicine

## 2023-06-05 IMAGING — CT CT RENAL STONE PROTOCOL
2 of 4 series · 16 of 46 positions shown, 18 images · non-contrast
Comparison: None.

CLINICAL DATA: Acute onset of right flank and right lower quadrant
pain. Nausea and vomiting.



[Series 3: stone full · axial · 0.75mm/px · z∈[+878,+1323]mm · 13 of 97 slices shown, 15 images]
[im 4/97  soft-tissue]
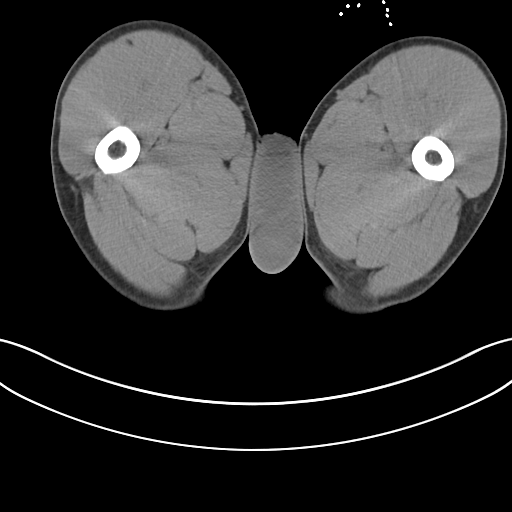
[im 4/97  bone]
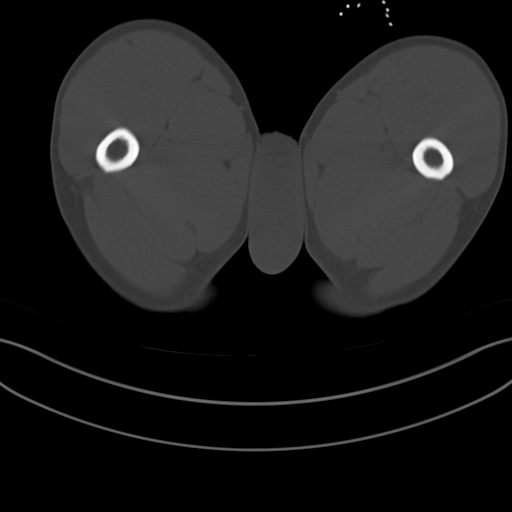
[im 12/97  soft-tissue]
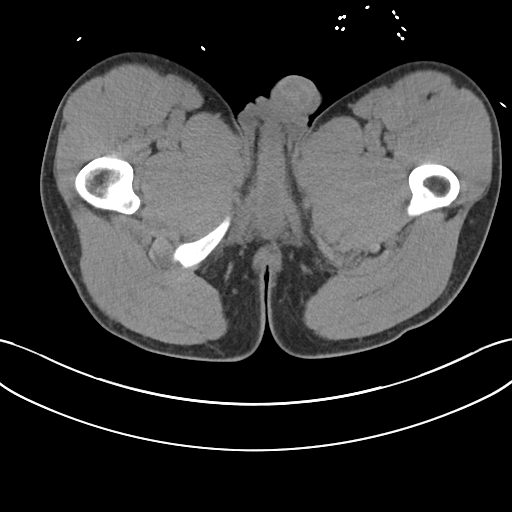
[im 19/97  soft-tissue]
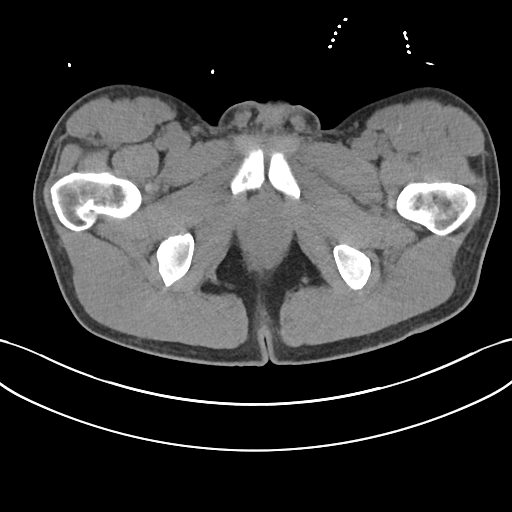
[im 26/97  soft-tissue]
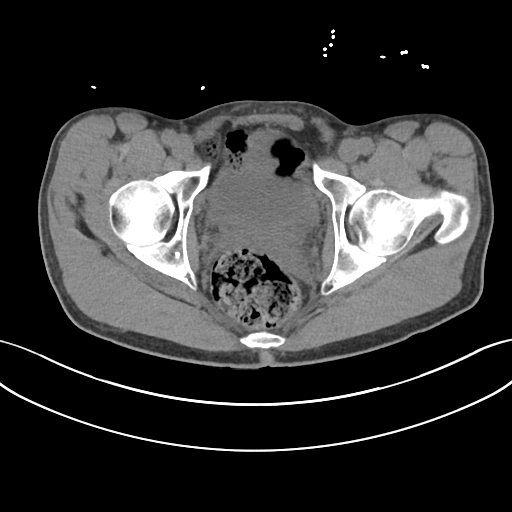
[im 34/97  soft-tissue]
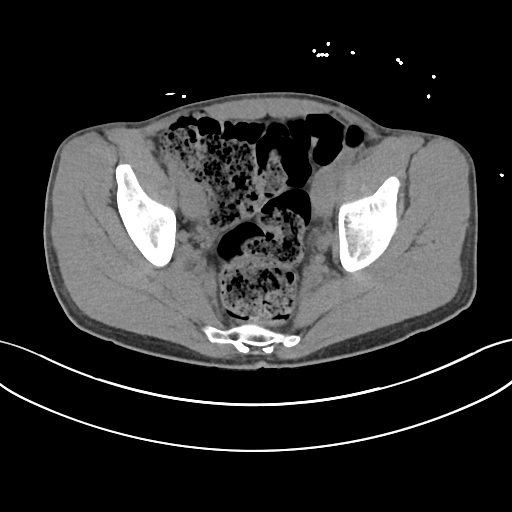
[im 41/97  soft-tissue]
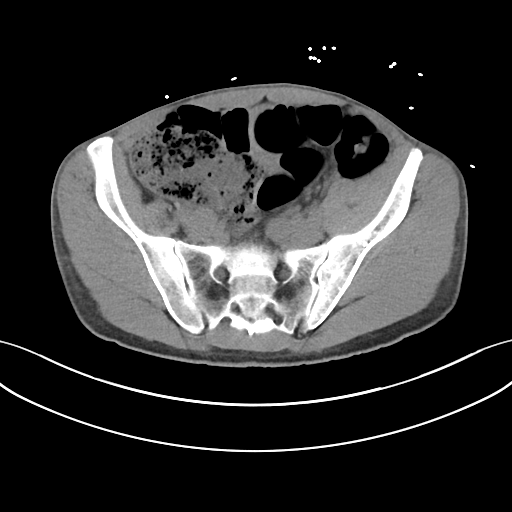
[im 49/97  soft-tissue]
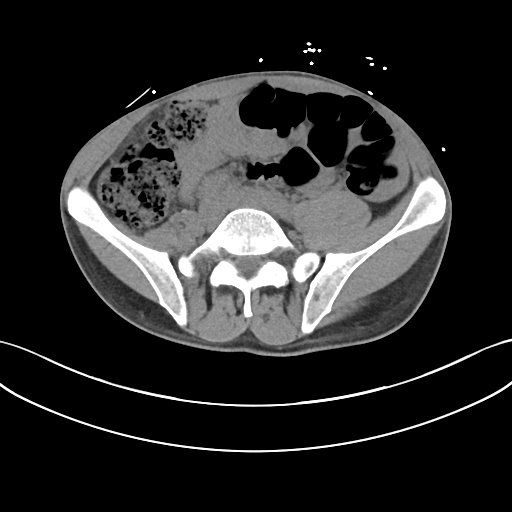
[im 56/97  soft-tissue]
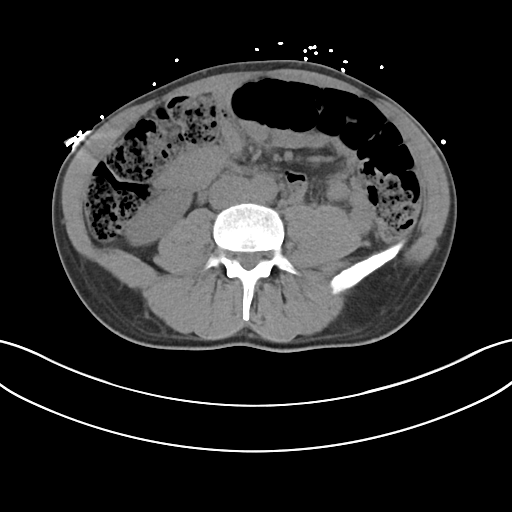
[im 63/97  soft-tissue]
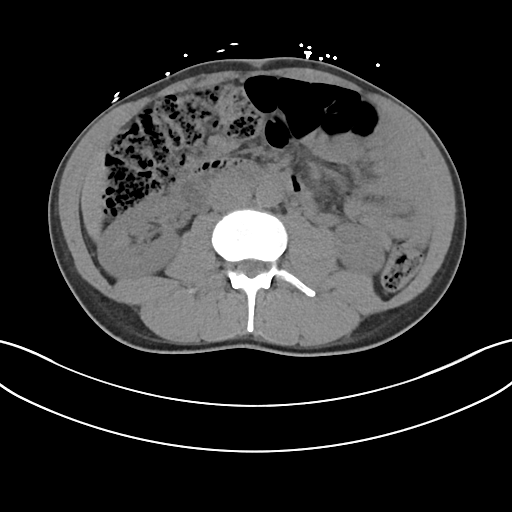
[im 63/97  bone]
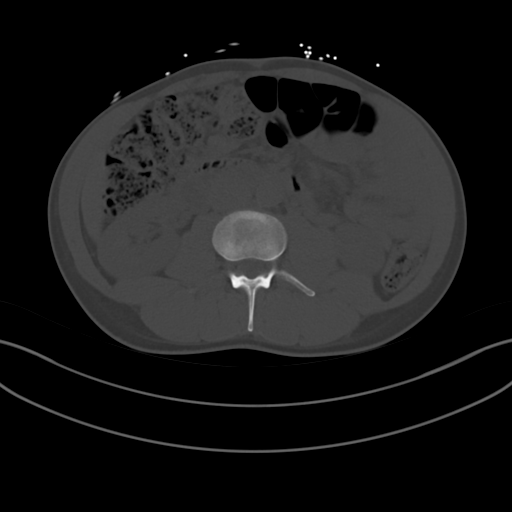
[im 71/97  soft-tissue]
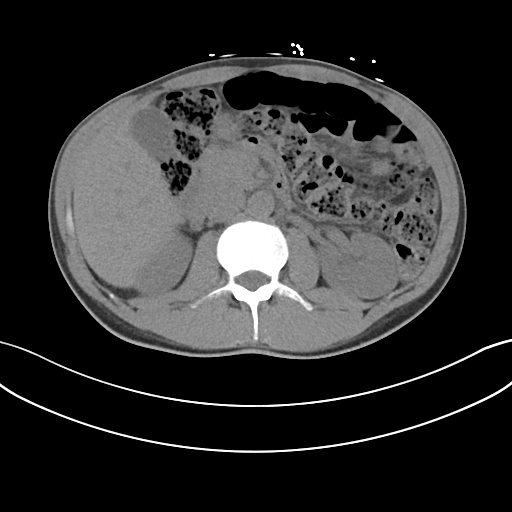
[im 78/97  soft-tissue]
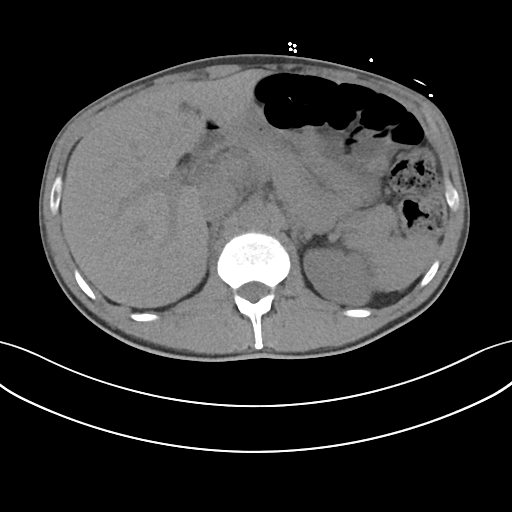
[im 85/97  soft-tissue]
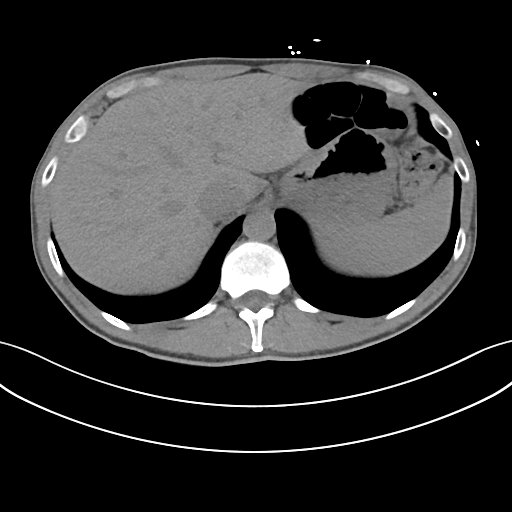
[im 93/97  soft-tissue]
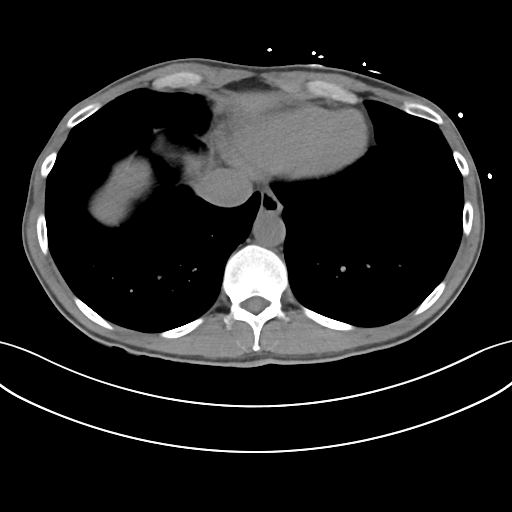

[Series 6: coronal · coronal · 0.72mm/px · 3 of 90 slices shown]
[im 30/90  soft-tissue]
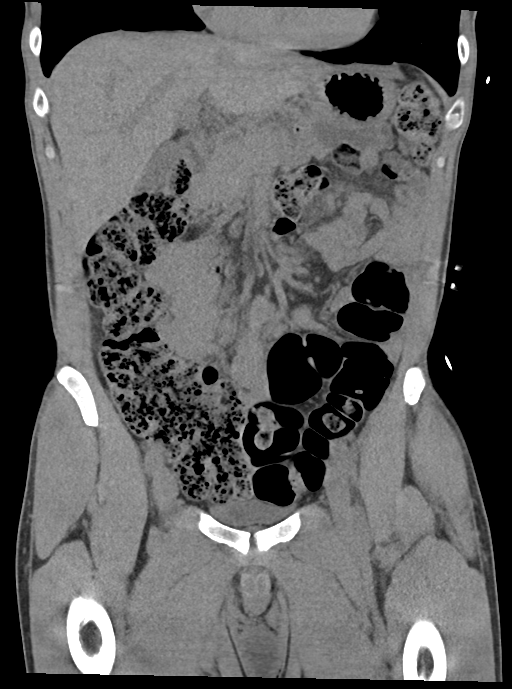
[im 40/90  soft-tissue]
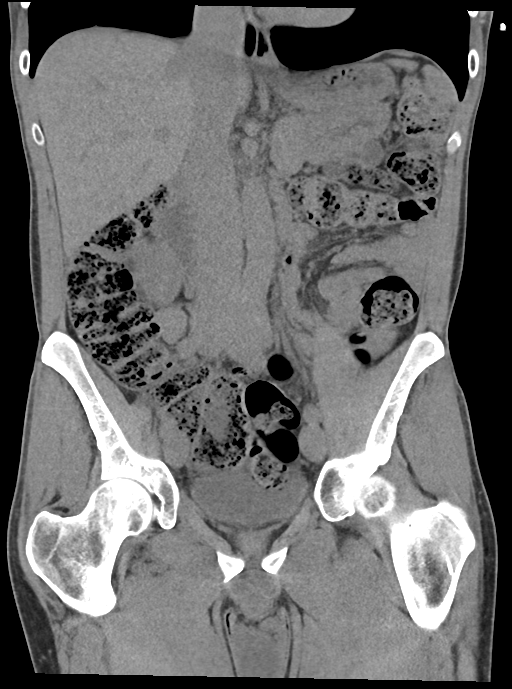
[im 50/90  soft-tissue]
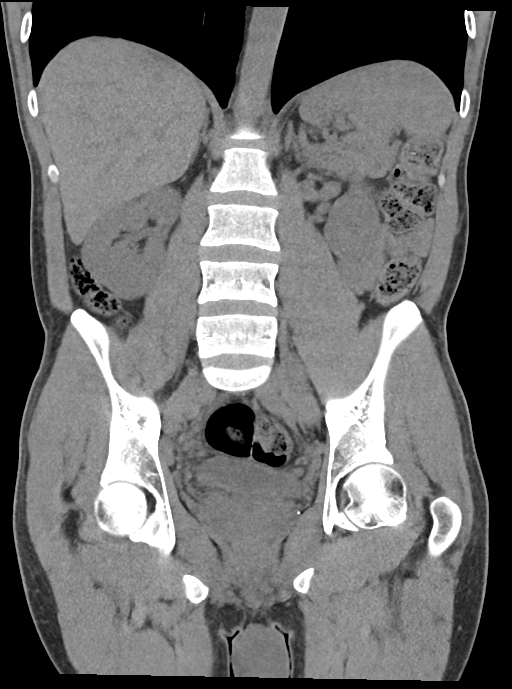

[16 of 46 positions shown; findings below may reference images not displayed]

FINDINGS: Lower chest: No acute findings.

Hepatobiliary: No mass visualized on this unenhanced exam.
Gallbladder is unremarkable. No evidence of biliary ductal
dilatation.

Pancreas: No mass or inflammatory process visualized on this
unenhanced exam.

Spleen:  Within normal limits in size.

Adrenals/Urinary tract: No evidence of urolithiasis or
hydronephrosis. Unremarkable unopacified urinary bladder.

Stomach/Bowel: No evidence of obstruction, inflammatory process, or
abnormal fluid collections. Although the appendix is not directly
visualized, no inflammatory process seen in region of the cecum or
elsewhere. Large amount of stool noted throughout the colon.

Vascular/Lymphatic: No pathologically enlarged lymph nodes
identified. No evidence of abdominal aortic aneurysm.

Reproductive:  No mass or other significant abnormality.

Other:  None.

Musculoskeletal:  No suspicious bone lesions identified.
IMPRESSION: No evidence of urolithiasis, hydronephrosis, or other acute
findings.

Large stool burden noted; recommend clinical correlation for
possible constipation.

## 2023-06-08 ENCOUNTER — Other Ambulatory Visit: Payer: Self-pay | Admitting: Family Medicine

## 2023-08-28 ENCOUNTER — Other Ambulatory Visit: Payer: Self-pay | Admitting: Family Medicine

## 2023-08-28 DIAGNOSIS — E785 Hyperlipidemia, unspecified: Secondary | ICD-10-CM

## 2023-09-05 ENCOUNTER — Other Ambulatory Visit: Payer: Self-pay | Admitting: Family Medicine

## 2023-09-07 ENCOUNTER — Other Ambulatory Visit: Payer: Self-pay | Admitting: Family Medicine

## 2023-12-13 ENCOUNTER — Other Ambulatory Visit: Payer: Self-pay | Admitting: Family Medicine

## 2024-03-01 ENCOUNTER — Other Ambulatory Visit: Payer: Self-pay | Admitting: Family Medicine

## 2024-03-01 DIAGNOSIS — E785 Hyperlipidemia, unspecified: Secondary | ICD-10-CM

## 2024-03-04 ENCOUNTER — Other Ambulatory Visit: Payer: 59

## 2024-03-04 ENCOUNTER — Encounter: Payer: 59 | Admitting: Family Medicine

## 2024-03-11 ENCOUNTER — Encounter: Payer: Self-pay | Admitting: Family Medicine

## 2024-03-11 ENCOUNTER — Ambulatory Visit (INDEPENDENT_AMBULATORY_CARE_PROVIDER_SITE_OTHER): Admitting: Family Medicine

## 2024-03-11 VITALS — BP 110/72 | HR 54 | Temp 98.1°F | Ht 72.5 in | Wt 166.4 lb

## 2024-03-11 DIAGNOSIS — Z0000 Encounter for general adult medical examination without abnormal findings: Secondary | ICD-10-CM

## 2024-03-11 DIAGNOSIS — G4733 Obstructive sleep apnea (adult) (pediatric): Secondary | ICD-10-CM | POA: Diagnosis not present

## 2024-03-11 DIAGNOSIS — Z13220 Encounter for screening for lipoid disorders: Secondary | ICD-10-CM | POA: Diagnosis not present

## 2024-03-11 DIAGNOSIS — Z131 Encounter for screening for diabetes mellitus: Secondary | ICD-10-CM

## 2024-03-11 LAB — HEPATIC FUNCTION PANEL
ALT: 19 U/L (ref 0–53)
AST: 26 U/L (ref 0–37)
Albumin: 4.5 g/dL (ref 3.5–5.2)
Alkaline Phosphatase: 53 U/L (ref 39–117)
Bilirubin, Direct: 0.1 mg/dL (ref 0.0–0.3)
Total Bilirubin: 0.8 mg/dL (ref 0.2–1.2)
Total Protein: 7.2 g/dL (ref 6.0–8.3)

## 2024-03-11 LAB — CBC WITH DIFFERENTIAL/PLATELET
Basophils Absolute: 0 10*3/uL (ref 0.0–0.1)
Basophils Relative: 0.7 % (ref 0.0–3.0)
Eosinophils Absolute: 0.1 10*3/uL (ref 0.0–0.7)
Eosinophils Relative: 2.1 % (ref 0.0–5.0)
HCT: 44 % (ref 39.0–52.0)
Hemoglobin: 14.7 g/dL (ref 13.0–17.0)
Lymphocytes Relative: 44.6 % (ref 12.0–46.0)
Lymphs Abs: 1.6 10*3/uL (ref 0.7–4.0)
MCHC: 33.4 g/dL (ref 30.0–36.0)
MCV: 96 fl (ref 78.0–100.0)
Monocytes Absolute: 0.3 10*3/uL (ref 0.1–1.0)
Monocytes Relative: 7.3 % (ref 3.0–12.0)
Neutro Abs: 1.6 10*3/uL (ref 1.4–7.7)
Neutrophils Relative %: 45.3 % (ref 43.0–77.0)
Platelets: 187 10*3/uL (ref 150.0–400.0)
RBC: 4.58 Mil/uL (ref 4.22–5.81)
RDW: 13 % (ref 11.5–15.5)
WBC: 3.6 10*3/uL — ABNORMAL LOW (ref 4.0–10.5)

## 2024-03-11 LAB — BASIC METABOLIC PANEL WITH GFR
BUN: 18 mg/dL (ref 6–23)
CO2: 30 meq/L (ref 19–32)
Calcium: 9.4 mg/dL (ref 8.4–10.5)
Chloride: 104 meq/L (ref 96–112)
Creatinine, Ser: 0.91 mg/dL (ref 0.40–1.50)
GFR: 102.65 mL/min (ref >60.00)
Glucose, Bld: 91 mg/dL (ref 70–99)
Potassium: 4.2 meq/L (ref 3.5–5.1)
Sodium: 140 meq/L (ref 135–145)

## 2024-03-11 LAB — LIPID PANEL
Cholesterol: 164 mg/dL (ref 0–200)
HDL: 68.1 mg/dL (ref >39.00)
LDL Cholesterol: 86 mg/dL (ref 0–99)
NonHDL: 96.17
Total CHOL/HDL Ratio: 2
Triglycerides: 52 mg/dL (ref 0.0–149.0)
VLDL: 10.4 mg/dL (ref 0.0–40.0)

## 2024-03-11 LAB — IBC + FERRITIN
Ferritin: 15.9 ng/mL — ABNORMAL LOW (ref 22.0–322.0)
Iron: 102 ug/dL (ref 42–165)
Saturation Ratios: 25 % (ref 20.0–50.0)
TIBC: 407.4 ug/dL (ref 250.0–450.0)
Transferrin: 291 mg/dL (ref 212.0–360.0)

## 2024-03-11 LAB — TESTOSTERONE: Testosterone: 355.76 ng/dL (ref 300.00–890.00)

## 2024-03-11 LAB — HEMOGLOBIN A1C: Hgb A1c MFr Bld: 5.5 % (ref 4.6–6.5)

## 2024-03-11 LAB — VITAMIN B12: Vitamin B-12: 543 pg/mL (ref 211–911)

## 2024-03-11 LAB — TSH: TSH: 1.3 u[IU]/mL (ref 0.35–5.50)

## 2024-03-11 LAB — FOLATE: Folate: >25.2 ng/mL (ref >5.9)

## 2024-03-11 LAB — VITAMIN D 25 HYDROXY (VIT D DEFICIENCY, FRACTURES): VITD: 20.11 ng/mL — ABNORMAL LOW (ref 30.00–100.00)

## 2024-03-11 MED ORDER — TRAZODONE HCL 50 MG PO TABS: ORAL_TABLET | ORAL | 1 refills | Status: DC

## 2024-03-11 MED ORDER — ESCITALOPRAM OXALATE 10 MG PO TABS: 10.0000 mg | ORAL_TABLET | Freq: Every day | ORAL | 3 refills | Status: AC

## 2024-03-11 NOTE — Progress Notes (Signed)
 Subjective:    Patient ID: Darren Mejia, male    DOB: [birth year], 43 y.o.   MRN: [id number]  HPI Here for a well exam. He feels fine, but he does describe some trouble sleeping lately. He thinks it may be stress related. He has OSA, and he uses a CPAP machine. He falls asleep quickly, but he wakes up frequently.    Review of Systems  Constitutional: Negative.   HENT: Negative.    Eyes: Negative.   Respiratory: Negative.    Cardiovascular: Negative.   Gastrointestinal: Negative.   Genitourinary: Negative.   Musculoskeletal: Negative.   Skin: Negative.   Neurological: Negative.   Psychiatric/Behavioral:  Positive for sleep disturbance.        Objective:   Physical Exam Constitutional:      General: He is not in acute distress.    Appearance: Normal appearance. He is well-developed. He is not diaphoretic.  HENT:     Head: Normocephalic and atraumatic.     Right Ear: External ear normal.     Left Ear: External ear normal.     Nose: Nose normal.     Mouth/Throat:     Pharynx: No oropharyngeal exudate.  Eyes:     General: No scleral icterus.       Right eye: No discharge.        Left eye: No discharge.     Conjunctiva/sclera: Conjunctivae normal.     Pupils: Pupils are equal, round, and reactive to light.  Neck:     Thyroid: No thyromegaly.     Vascular: No JVD.     Trachea: No tracheal deviation.  Cardiovascular:     Rate and Rhythm: Normal rate and regular rhythm.     Pulses: Normal pulses.     Heart sounds: Normal heart sounds. No murmur heard.    No friction rub. No gallop.  Pulmonary:     Effort: Pulmonary effort is normal. No respiratory distress.     Breath sounds: Normal breath sounds. No wheezing or rales.  Chest:     Chest wall: No tenderness.  Abdominal:     General: Bowel sounds are normal. There is no distension.     Palpations: Abdomen is soft. There is no mass.     Tenderness: There is no abdominal tenderness. There is no guarding or rebound.   Genitourinary:    Penis: Normal. No tenderness.      Testes: Normal.  Musculoskeletal:        General: No tenderness. Normal range of motion.     Cervical back: Neck supple.  Lymphadenopathy:     Cervical: No cervical adenopathy.  Skin:    General: Skin is warm and dry.     Coloration: Skin is not pale.     Findings: No erythema or rash.  Neurological:     General: No focal deficit present.     Mental Status: He is alert and oriented to person, place, and time.     Cranial Nerves: No cranial nerve deficit.     Motor: No abnormal muscle tone.     Coordination: Coordination normal.     Deep Tendon Reflexes: Reflexes are normal and symmetric. Reflexes normal.  Psychiatric:        Mood and Affect: Mood normal.        Behavior: Behavior normal.        Thought Content: Thought content normal.        Judgment: Judgment normal.  Assessment & Plan:  Well exam. We discussed diet and exercise. Get fasting labs. He will try Trazodone 50 mg at bedtime for sleep. Refer to Pulmonology for the sleep apnea. Set up his first colonoscopy.  Gershon Crane, MD

## 2024-03-13 ENCOUNTER — Encounter: Payer: Self-pay | Admitting: *Deleted

## 2024-04-12 ENCOUNTER — Other Ambulatory Visit: Payer: Self-pay | Admitting: Family Medicine

## 2024-06-06 ENCOUNTER — Encounter: Payer: Self-pay | Admitting: Internal Medicine

## 2024-06-06 ENCOUNTER — Ambulatory Visit (INDEPENDENT_AMBULATORY_CARE_PROVIDER_SITE_OTHER): Admitting: Internal Medicine

## 2024-06-06 VITALS — BP 104/70 | HR 66 | Ht 72.5 in | Wt 166.0 lb

## 2024-06-06 DIAGNOSIS — Z1211 Encounter for screening for malignant neoplasm of colon: Secondary | ICD-10-CM

## 2024-06-06 NOTE — Patient Instructions (Signed)
 We have placed a colon recall for December 2025. We will contact you when that time gets closer.  _______________________________________________________  If your blood pressure at your visit was 140/90 or greater, please contact your primary care physician to follow up on this.  _______________________________________________________  If you are age 45 or older, your body mass index should be between 23-30. Your Body mass index is 22.2 kg/m. If this is out of the aforementioned range listed, please consider follow up with your Primary Care Provider.  If you are age 94 or younger, your body mass index should be between 19-25. Your Body mass index is 22.2 kg/m. If this is out of the aformentioned range listed, please consider follow up with your Primary Care Provider.   ________________________________________________________  The [facility] providers would like to encourage you to use MYCHART to communicate with providers for non-urgent requests or questions.  Due to long hold times on the telephone, sending your provider a message by [facility] may be a faster and more efficient way to get a response.  Please allow 48 business hours for a response.  Please remember that this is for non-urgent requests.  _______________________________________________________  I appreciate the opportunity to care for you. Lupita Commander, MD, [facility]

## 2024-06-06 NOTE — Progress Notes (Signed)
   Darren Mejia 42 y.o. [birth year] [id number]  Assessment & Plan:   Encounter Diagnosis  Name Primary?   Colon cancer screening Yes   We will place a recall for December when he turns 43 and he can have a direct colonoscopy at that time.    Subjective:   Chief Complaint: Discuss colonoscopy for colon cancer screening.  HPI 43 year old man known to me with a history of internal hemorrhoids found at flexible sigmoidoscopy in January 2016.  He saw Dr. Johnny primary care March 11, 2024, notes indicated he is on CPAP for OSA and trazodone  was prescribed for insomnia and he was referred to pulmonary for sleep apnea and set up his first colonoscopy.  The patient is not having any active symptoms at this time i.e. no rectal bleeding change in bowel habits abdominal pain.  No Known Allergies Current Meds  Medication Sig   atorvastatin  (LIPITOR) 10 MG tablet TAKE 1 TABLET BY MOUTH EVERY DAY   cholecalciferol (VITAMIN D3) 25 MCG (1000 UNIT) tablet Take 1,000 Units by mouth daily.   Dapsone  7.5 % GEL Apply twice daily   escitalopram  (LEXAPRO ) 10 MG tablet Take 1 tablet (10 mg total) by mouth daily.   fluticasone  (FLONASE ) 50 MCG/ACT nasal spray Place 2 sprays into both nostrils daily.   folic acid  (FOLVITE ) 400 MCG tablet Take 2 tablets (800 mcg total) by mouth daily.   Iron Combinations (CHROMAGEN) capsule Take 1 capsule by mouth daily.   OVER THE COUNTER MEDICATION    propranolol  (INDERAL ) 10 MG tablet TAKE 1 TABLET (10 MG TOTAL) BY MOUTH DAILY AS NEEDED (PUBLIC SPEAKING).   Past Medical History:  Diagnosis Date   Acne rosacea    Depression with anxiety    Male pattern baldness    OSA (obstructive sleep apnea)    wears CPAP    Perforated ear drum    left TM, sees Dr. Rosaline Litten at [facility]   Past Surgical History:  Procedure Laterality Date   FLEXIBLE SIGMOIDOSCOPY  12/18/2014   per Dr. Avram, internal hemorrhoids   HIP ARTHROSCOPY Right 12/11/2012   per Dr. Marylen Hopping at [facility]    SEPTOPLASTY  12/11/2005   TYMPANOPLASTY Left 04/17/2022   (redo) per Dr. Rosaline Litten at [facility]   Social History   Social History Narrative   Married with 2 sons   Programme researcher, broadcasting/film/video for [employer]   4 caffeinated beverages daily, never smoker no alcohol no drug use   family history includes Atrial fibrillation in his father; Colon polyps in his father; Emphysema in his paternal grandfather; Heart disease in his maternal grandfather; Hyperlipidemia in his father; Hypertension in his father; Prostate cancer in his maternal uncle; Stroke in his father.   Review of Systems As per HPI otherwise negative  Objective:   Physical Exam BP 104/70   Pulse 66   Ht 6' 0.5 (1.842 m)   Wt 166 lb (75.3 kg)   BMI 22.20 kg/m

## 2024-10-01 ENCOUNTER — Other Ambulatory Visit: Payer: Self-pay | Admitting: Family Medicine

## 2024-10-01 DIAGNOSIS — E785 Hyperlipidemia, unspecified: Secondary | ICD-10-CM

## 2024-11-11 ENCOUNTER — Ambulatory Visit

## 2024-11-11 VITALS — Ht 72.5 in | Wt 163.0 lb

## 2024-11-11 DIAGNOSIS — Z1211 Encounter for screening for malignant neoplasm of colon: Secondary | ICD-10-CM

## 2024-11-11 MED ORDER — NA SULFATE-K SULFATE-MG SULF 17.5-3.13-1.6 GM/177ML PO SOLN: 1.0000 | Freq: Once | ORAL | 0 refills | Status: AC

## 2024-11-11 NOTE — Progress Notes (Signed)

## 2024-11-25 ENCOUNTER — Encounter: Admitting: Internal Medicine

## 2024-11-26 ENCOUNTER — Other Ambulatory Visit: Payer: Self-pay | Admitting: Family Medicine

## 2024-11-30 ENCOUNTER — Other Ambulatory Visit: Payer: Self-pay | Admitting: Family Medicine
